# Patient Record
Sex: Male | Born: 1990
Health system: Southern US, Community
[De-identification: ages and names within clinical notes are randomized; demographics above are authoritative.]

## PROBLEM LIST (undated history)

## (undated) DIAGNOSIS — T7840XA Allergy, unspecified, initial encounter: Secondary | ICD-10-CM

## (undated) DIAGNOSIS — H1010 Acute atopic conjunctivitis, unspecified eye: Secondary | ICD-10-CM

## (undated) HISTORY — DX: Allergy, unspecified, initial encounter: T78.40XA

## (undated) HISTORY — DX: Acute atopic conjunctivitis, unspecified eye: H10.10

---

## 2009-01-05 ENCOUNTER — Ambulatory Visit: Payer: Self-pay | Admitting: Family Medicine

## 2009-01-05 DIAGNOSIS — H1045 Other chronic allergic conjunctivitis: Secondary | ICD-10-CM

## 2009-01-05 DIAGNOSIS — J309 Allergic rhinitis, unspecified: Secondary | ICD-10-CM | POA: Insufficient documentation

## 2009-06-07 ENCOUNTER — Ambulatory Visit: Payer: Self-pay | Admitting: Family Medicine

## 2009-06-07 DIAGNOSIS — R21 Rash and other nonspecific skin eruption: Secondary | ICD-10-CM | POA: Insufficient documentation

## 2009-06-07 DIAGNOSIS — R55 Syncope and collapse: Secondary | ICD-10-CM

## 2009-06-22 ENCOUNTER — Encounter: Payer: Self-pay | Admitting: Family Medicine

## 2009-06-22 ENCOUNTER — Ambulatory Visit: Payer: Self-pay

## 2009-08-01 ENCOUNTER — Telehealth: Payer: Self-pay | Admitting: Family Medicine

## 2009-08-02 ENCOUNTER — Ambulatory Visit: Payer: Self-pay | Admitting: Family Medicine

## 2009-08-02 DIAGNOSIS — R5383 Other fatigue: Secondary | ICD-10-CM

## 2009-08-02 DIAGNOSIS — R5381 Other malaise: Secondary | ICD-10-CM

## 2009-08-07 ENCOUNTER — Encounter (INDEPENDENT_AMBULATORY_CARE_PROVIDER_SITE_OTHER): Payer: Self-pay | Admitting: *Deleted

## 2009-08-07 LAB — CONVERTED CEMR LAB: Hgb A2 Quant: 2.7 % (ref 2.2–3.2)

## 2010-07-02 ENCOUNTER — Ambulatory Visit: Payer: Self-pay | Admitting: Family Medicine

## 2010-10-17 NOTE — Assessment & Plan Note (Signed)
Summary: SP0RTS PHYSICAL/CLE   Vital Signs:  Patient profile:   20 year old male Height:      72 inches Weight:      170 pounds BMI:     23.14 Temp:     98.4 degrees F oral Pulse rate:   60 / minute Pulse rhythm:   regular BP sitting:   110 / 68  (left arm) Cuff size:   regular  Vitals Entered By: Sydell Axon LPN (July 02, 2010 11:11 AM) CC: Needs sports physical for college  Vision Screening:Left eye w/o correction: 20 / 20 Right Eye w/o correction: 20 / 20 Both eyes w/o correction:  20/ 20         History of Present Illness: Sports physical.  See scanned form.   Allergies: No Known Drug Allergies  Past History:  Past Medical History: Allergic rhinitis Allergic conjunctivitis neg w/u for sickle cell trait/disease  Family History: Reviewed history from 01/05/2009 and no changes required. F alive, healthy M alive, healthy 1 Sister sickle cell trait 1 sister healthy  Social History: Reviewed history from 01/05/2009 and no changes required. 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  Otherwise negative.    Physical Exam  General:  see scanned form.    Impression & Recommendations:  Problem # 1:  ATHLETIC PHYSICAL, NORMAL (ICD-V70.3) Okay for club sports at [employer].  He'll check with his mother about his full shot record.  Flu shot encouraged, he can get this at school.  Healthy by exam.  Healthy habits encouraged.  follow up as needed.  Orders: Sports Physical (Sport)  Complete Medication List: 1)  Allegra 180 Mg Tabs (Fexofenadine hcl) .Marland Kitchen.. 1 by mouth daily as needed as needed 2)  Nasonex 50 Mcg/act Susp (Mometasone furoate) .... 2 sprays each nostril daily as needed 3)  Pataday 0.2 % Soln (Olopatadine hcl) .Marland Kitchen.. 1 drop each eye daily as needed  Patient Instructions: 1)  Let me know if you have any  concerns.  Take care and good luck with school.  Glad to see you today.    Orders Added: 1)  Sports Physical [Sport]    Current Allergies (reviewed today): No known allergies

## 2010-10-17 NOTE — Letter (Signed)
Summary: Sports Physical Form  Sports Physical Form   Imported By: Maryln Gottron 07/06/2010 15:41:42  _____________________________________________________________________  External Attachment:    Type:   Image     Comment:   External Document

## 2012-05-27 ENCOUNTER — Encounter: Payer: Self-pay | Admitting: Family Medicine

## 2012-05-27 ENCOUNTER — Ambulatory Visit (INDEPENDENT_AMBULATORY_CARE_PROVIDER_SITE_OTHER): Payer: BC Managed Care – PPO | Admitting: Family Medicine

## 2012-05-27 VITALS — BP 112/76 | HR 75 | Temp 98.6°F | Ht 73.0 in | Wt 173.2 lb

## 2012-05-27 DIAGNOSIS — Z0000 Encounter for general adult medical examination without abnormal findings: Secondary | ICD-10-CM

## 2012-05-27 NOTE — Progress Notes (Signed)
Nature conservation officer at [facility] [address] [place] [address] Phone: [phone] Fax: [phone]  Date:  05/27/2012   Name:  Eric Burch   DOB:  1991/01/02   MRN:  [id number] Gender: male Age: 21 y.o.  PCP:  Hannah Beat, MD    Chief Complaint: Annual Exam   History of Present Illness:  Eric Burch is a 21 y.o. pleasant patient who presents with the following:  The patient is here for an annual exam, and a sports physical. He is generally feeling perfectly well other than having some mild flare up of his allergies with allergic rhinitis and allergic conjunctivitis.  He denies any STDs exposure or risk. No recent orthopedic trauma or injuries. No prior surgeries. No family history of sudden death or sudden cardiac death at an early age.  The patient did have an episode where he got lightheaded and passed out in 2010, but ultimately had an echocardiogram that was perfectly normal. This was noted from record review.  Patient Active Problem List  Diagnosis  . ALLERGIC CONJUNCTIVITIS  . ALLERGIC RHINITIS    Past Medical History  Diagnosis Date  . Allergic rhinitis   . Allergic conjunctivitis     No past surgical history on file.  History  Substance Use Topics  . Smoking status: Never Smoker   . Smokeless tobacco: Not on file  . Alcohol Use: No    Family History  Problem Relation Age of Onset  . Sickle cell trait Sister     Allergies not on file  Medication list has been reviewed and updated.  Current Outpatient Prescriptions on File Prior to Visit  Medication Sig Dispense Refill  . fexofenadine (ALLEGRA) 180 MG tablet Take 180 mg by mouth daily.        . mometasone (NASONEX) 50 MCG/ACT nasal spray Place 2 sprays into the nose daily as needed.        . Olopatadine HCl (PATADAY) 0.2 % SOLN Apply to eye daily as needed.          Review of Systems:  No fevers chills or sweats. No chest pain or shortness of breath. No recent orthopedic  injuries. Otherwise, the pertinent positives and negatives are listed above and in the HPI, otherwise a full review of systems has been reviewed and is negative unless noted positive.   Physical Examination: Filed Vitals:   05/27/12 1029  BP: 112/76  Pulse: 75  Temp: 98.6 F (37 C)   Filed Vitals:   05/27/12 1029  Height: 6\' 1"  (1.854 m)  Weight: 173 lb 4 oz (78.586 kg)   Body mass index is 22.86 kg/(m^2). Ideal Body Weight: Weight in (lb) to have BMI = 25: 189.1   Vision: 20/20  GEN: well developed, well nourished, no acute distress Eyes: conjunctiva and lids normal, PERRLA, EOMI ENT: TM clear, nares clear, oral exam WNL Neck: supple, no lymphadenopathy, no thyromegaly, no JVD Pulm: clear to auscultation and percussion, respiratory effort normal CV: regular rate and rhythm, S1-S2, no murmur, rub or gallop, no bruits, peripheral pulses normal and symmetric, no cyanosis, clubbing, edema or varicosities GI: soft, non-tender; no hepatosplenomegaly, masses; active bowel sounds all quadrants Lymph: no cervical, axillary or inguinal adenopathy MSK: gait normal, muscle tone and strength WNL, no joint swelling, effusions, discoloration, crepitus  Upper extremity strength and lower extremity strength is 5/5. Full range of motion in all directions.  Neck range of motion is full and normal.  Knee excellent range of  motion. No pain on the joint lines. MCL and LCL are stable. Negative Lachman. Negative posterior and anterior drawer testing.  Ankles are stable with a slight increase in laxity on the left ankle which correlates to a prior ankle sprain. Strength is preserved throughout.  All bony anatomy is nontender. SKIN: clear, good turgor, color WNL, no rashes, lesions, or ulcerations Neuro: normal mental status, normal strength, sensation, and motion Psych: alert; oriented to person, place and time, normally interactive and not anxious or depressed in appearance.   Assessment and  Plan:  1. Routine general medical examination at a health care facility    Patient doing well and very healthy with some occasional allergy symptoms right now.  From a sports standpoint, he should be cleared to play without any limitations.  Followup when necessary.   Hannah Beat, MD 05/27/2012, 10:49 AM

## 2014-03-06 ENCOUNTER — Encounter (HOSPITAL_COMMUNITY): Payer: Self-pay | Admitting: Emergency Medicine

## 2014-03-06 ENCOUNTER — Emergency Department (HOSPITAL_COMMUNITY)
Admission: EM | Admit: 2014-03-06 | Discharge: 2014-03-06 | Disposition: A | Discharge status: Home / Self Care | Payer: BC Managed Care – PPO | Attending: Emergency Medicine | Admitting: Emergency Medicine

## 2014-03-06 DIAGNOSIS — IMO0002 Reserved for concepts with insufficient information to code with codable children: Secondary | ICD-10-CM | POA: Insufficient documentation

## 2014-03-06 DIAGNOSIS — Z79899 Other long term (current) drug therapy: Secondary | ICD-10-CM | POA: Insufficient documentation

## 2014-03-06 DIAGNOSIS — Z8669 Personal history of other diseases of the nervous system and sense organs: Secondary | ICD-10-CM | POA: Insufficient documentation

## 2014-03-06 DIAGNOSIS — Z8709 Personal history of other diseases of the respiratory system: Secondary | ICD-10-CM | POA: Insufficient documentation

## 2014-03-06 DIAGNOSIS — H1045 Other chronic allergic conjunctivitis: Secondary | ICD-10-CM | POA: Insufficient documentation

## 2014-03-06 DIAGNOSIS — R599 Enlarged lymph nodes, unspecified: Secondary | ICD-10-CM | POA: Insufficient documentation

## 2014-03-06 DIAGNOSIS — B2790 Infectious mononucleosis, unspecified without complication: Secondary | ICD-10-CM | POA: Insufficient documentation

## 2014-03-06 LAB — RAPID STREP SCREEN (MED CTR MEBANE ONLY): Streptococcus, Group A Screen (Direct): NEGATIVE

## 2014-03-06 MED ORDER — PREDNISONE 20 MG PO TABS: 40.0000 mg | ORAL_TABLET | Freq: Every day | ORAL | Status: DC

## 2014-03-06 NOTE — ED Notes (Signed)
Pt reports that he was recently dx with mono. States he has been taking prednisone, tylenol cold/flu, and sore throat spray but states he is continuing to have sore throat. States "while I was in church today it started to feel like my throat was swelling and I couldn't breathe very well." Pt airway intact, white coating noted to right side of throat. Pt speaks in complete sentences, no drooling. Lungs clear.

## 2014-03-06 NOTE — Discharge Instructions (Signed)
Infectious Mononucleosis Infectious mononucleosis (mono) is a common viral infection. You can get mono from close contact with someone who is infected. If you have mono, you may have a sore throat, headache, feel tired, or have a fever. HOME CARE  Drink enough fluids to keep your pee (urine) clear or pale yellow.  Eat soft foods. Eat cold foods (frozen ice pops, ice cream) to make your throat feel better.  Only take medicines as told by your doctor. Do not give aspirin to children.  Rest as needed. Have someone with you to make sure you do not get worse.  Do not  play contact sports. Avoid activities where you could get hurt for 3 to 4 weeks. The organ that cleans your blood (spleen) might be puffy (swollen), and it could get hurt.  Wash your hands or use hand sanitizer often. Avoid kissing or sharing your drinking glass until your doctor says you are better.  Keep all follow-up visits as told by your doctor. GET HELP RIGHT AWAY IF:   You have strong pain in your stomach or shoulder.  You have trouble breathing or swallowing.  You are confused.  You get a strong headache or stiff neck.  You are shaking (convulsing).  You keep throwing up (vomiting).  You are weak, with pale skin, dry mouth, and fast heartbeat.  Your fever does not go away after 7 days.  You cannot return to normal activities after 2 weeks.  You have yellow color in the eyes and skin (jaundice). MAKE SURE YOU:   Understand these instructions.  Will watch your condition.  Will get help right away if you are not doing well or get worse. Document Released: 08/21/2009 Document Revised: 11/25/2011 Document Reviewed: 08/21/2009 [facility] Patient Information 2015 [employer] [place]. This information is not intended to replace advice given to you by your health care provider. Make sure you discuss any questions you have with your health care provider.

## 2014-03-06 NOTE — ED Provider Notes (Signed)
CSN: [id number]     Arrival date & time 03/06/14  1156 History   First MD Initiated Contact with Patient 03/06/14 1243     Chief Complaint  Patient presents with  . Sore Throat     (Consider location/radiation/quality/duration/timing/severity/associated sxs/prior Treatment) HPI Comments: Patient is an otherwise healthy 23 year old male who presents today with persistent sore throat. He reports that on Tuesday [place] mono via blood test. He has been having sore throat which is worse with swallowing since Tuesday. He does not feel improved. He has had subjective fevers. No abdominal pain, nausea, vomiting. He has been taking prednisone and Aleve as prescribed. He last took Aleve at 730 and prednisone at 11. He reports that while he was in church today it felt as though his throat was closing. He currently does not feel short of breath.  Patient is a 23 y.o. male presenting with pharyngitis. The history is provided by the patient. No language interpreter was used.  Sore Throat Associated symptoms include fatigue, a fever and a sore throat. Pertinent negatives include no abdominal pain, chest pain, nausea or vomiting.    Past Medical History  Diagnosis Date  . Allergic rhinitis   . Allergic conjunctivitis    History reviewed. No pertinent past surgical history. Family History  Problem Relation Age of Onset  . Sickle cell trait Sister    History  Substance Use Topics  . Smoking status: Never Smoker   . Smokeless tobacco: Not on file  . Alcohol Use: No    Review of Systems  Constitutional: Positive for fever and fatigue.  HENT: Positive for sore throat. Negative for ear pain.   Respiratory: Negative for shortness of breath.   Cardiovascular: Negative for chest pain.  Gastrointestinal: Negative for nausea, vomiting and abdominal pain.  All other systems reviewed and are negative.     Allergies  Review of patient's  allergies indicates no known allergies.  Home Medications   Prior to Admission medications   Medication Sig Start Date End Date Taking? Authorizing Provider  fexofenadine (ALLEGRA) 180 MG tablet Take 180 mg by mouth daily.      Historical Provider, MD  mometasone (NASONEX) 50 MCG/ACT nasal spray Place 2 sprays into the nose daily as needed.      Historical Provider, MD  Olopatadine HCl (PATADAY) 0.2 % SOLN Apply to eye daily as needed.      Historical Provider, MD   BP 138/76  Pulse 73  Temp(Src) 98.6 F (37 C) (Oral)  Resp 20  SpO2 99% Physical Exam  Nursing note and vitals reviewed. Constitutional: He is oriented to person, place, and time. He appears well-developed and well-nourished. No distress.  Non toxic appearing.   HENT:  Head: Normocephalic and atraumatic.  Right Ear: External ear normal.  Left Ear: External ear normal.  Nose: Nose normal.  Mouth/Throat: Uvula is midline. No trismus in the jaw. Oropharyngeal exudate and posterior oropharyngeal erythema present. No tonsillar abscesses.  Bilateral tonsillar enlargement with exudate. Easily maintaining own secretions. No drooling. No trismus, submental edema, tongue elevation.   Eyes: Conjunctivae are normal.  Neck: Normal range of motion. No tracheal deviation present.  No nuchal rigidity or meningeal signs  Cardiovascular: Normal rate, regular rhythm and normal heart sounds.   Pulmonary/Chest: Effort normal and breath sounds normal. No stridor.  Abdominal: Soft. He exhibits no distension. There is no tenderness. There is no rigidity and no guarding.  Musculoskeletal: Normal  range of motion.  Lymphadenopathy:    He has cervical adenopathy.  Neurological: He is alert and oriented to person, place, and time.  Skin: Skin is warm and dry. He is not diaphoretic.  Psychiatric: He has a normal mood and affect. His behavior is normal.    ED Course  Procedures (including critical care time) Labs Review Labs Reviewed  RAPID  STREP SCREEN  CULTURE, GROUP A STREP    Imaging Review No results found.   EKG Interpretation None      MDM   Final diagnoses:  Mononucleosis    Patient presents to ED with persistent sore throat after being diagnosed with mononucleosis via monospot on Tuesday. Patient has been compliant with prednisone and aleve for symptoms. Today patient does have bilateral tonsillar enlargement with exudate. No trismus, submental edema, tongue elevation. No nuchal rigidity or meningeal signs. Patient is nontoxic appearing. No concern for PTA, RPA, deep space infection, meningitis. Discussed course of mono with patient and provided note for work. Gave patient extension of prednisone and encouraged patient to take 5 day burst when finished with his steroids. Discussed strict return instructions. Discussed case with Dr. Fonnie JarvisBednar who agrees with plan. Vital signs stable for discharge. Patient / Family / Caregiver informed of clinical course, understand medical decision-making process, and agree with plan.     Mora BellmanHannah S Jibril Mcminn, PA-C 03/06/14 1542

## 2014-03-07 NOTE — ED Provider Notes (Signed)
Medical screening examination/treatment/procedure(s) were performed by non-physician practitioner and as supervising physician I was immediately available for consultation/collaboration.   EKG Interpretation None       Hurman HornJohn M Bednar, MD 03/07/14 1424

## 2014-03-08 LAB — CULTURE, GROUP A STREP

## 2014-06-24 ENCOUNTER — Ambulatory Visit: Payer: BC Managed Care – PPO | Admitting: Internal Medicine

## 2014-06-24 DIAGNOSIS — Z0289 Encounter for other administrative examinations: Secondary | ICD-10-CM

## 2015-07-28 ENCOUNTER — Ambulatory Visit (INDEPENDENT_AMBULATORY_CARE_PROVIDER_SITE_OTHER): Payer: 59 | Admitting: Emergency Medicine

## 2015-07-28 VITALS — BP 108/72 | HR 78 | Temp 98.8°F | Resp 18 | Ht 74.0 in | Wt 179.0 lb

## 2015-07-28 DIAGNOSIS — J0140 Acute pansinusitis, unspecified: Secondary | ICD-10-CM | POA: Diagnosis not present

## 2015-07-28 MED ORDER — PSEUDOEPHEDRINE-GUAIFENESIN ER 60-600 MG PO TB12: 1.0000 | ORAL_TABLET | Freq: Two times a day (BID) | ORAL | Status: AC

## 2015-07-28 MED ORDER — AMOXICILLIN-POT CLAVULANATE 875-125 MG PO TABS: 1.0000 | ORAL_TABLET | Freq: Two times a day (BID) | ORAL | Status: DC

## 2015-07-28 NOTE — Patient Instructions (Signed)

## 2015-07-28 NOTE — Progress Notes (Signed)
Subjective:  Patient ID: Eric Burch, male    DOB: 1990-11-27  Age: 24 y.o. MRN: [id number]  CC: Sinusitis; Facial Pain; Fever; Chills; Nasal Congestion; Diarrhea; and Emesis   HPI Eric Burch presents  patient nail last 2 weeks with nasal congestion postnasal drainage and nasal discharge. Purulent collar. He has night sweats and fevers. He's has pressure and pain around his cheeks and forehead. He has no nausea or vomiting. He's had some loose stool at times. He's had no improvement with over-the-counter medication.  History Eric Burch has a past medical history of Allergic rhinitis; Allergic conjunctivitis; and Allergy.   He has no past surgical history on file.   His  family history includes Sickle cell trait in his sister.  He   reports that he has never smoked. He does not have any smokeless tobacco history on file. He reports that he drinks alcohol. He reports that he does not use illicit drugs.  Outpatient Prescriptions Prior to Visit  Medication Sig Dispense Refill  . ibuprofen (ADVIL,MOTRIN) 200 MG tablet Take 200 mg by mouth every 6 (six) hours as needed.    . mometasone (NASONEX) 50 MCG/ACT nasal spray Place 2 sprays into the nose daily as needed.      . naproxen sodium (ANAPROX) 220 MG tablet Take 220 mg by mouth 2 (two) times daily as needed (for pain).    . predniSONE (DELTASONE) 10 MG tablet Take 10 mg by mouth See admin instructions. 6 day pack    . predniSONE (DELTASONE) 20 MG tablet Take 2 tablets (40 mg total) by mouth daily. (Patient not taking: Reported on 07/28/2015) 10 tablet 0   No facility-administered medications prior to visit.    Social History   Social History  . Marital Status: Significant Other    Spouse Name: N/A  . Number of Children: N/A  . Years of Education: N/A   Social History Main Topics  . Smoking status: Never Smoker   . Smokeless tobacco: None  . Alcohol Use: 0.0 - 0.6 oz/week    0-1 Standard drinks or equivalent per week  .  Drug Use: No  . Sexual Activity: Not Asked   Other Topics Concern  . 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 Regularly exercises.      Review of Systems  Constitutional: Positive for fever, chills and fatigue. Negative for appetite change.  HENT: Positive for congestion, postnasal drip, rhinorrhea and sinus pressure. Negative for ear pain and sore throat.   Eyes: Negative for pain and redness.  Respiratory: Negative for cough, shortness of breath and wheezing.   Cardiovascular: Negative for leg swelling.  Gastrointestinal: Negative for nausea, vomiting, abdominal pain, diarrhea, constipation and blood in stool.  Endocrine: Negative for polyuria.  Genitourinary: Negative for dysuria, urgency, frequency and flank pain.  Musculoskeletal: Negative for gait problem.  Skin: Negative for rash.  Neurological: Negative for weakness and headaches.  Psychiatric/Behavioral: Negative for confusion and decreased concentration. The patient is not nervous/anxious.     Objective:  BP 108/72 mmHg  Pulse 78  Temp(Src) 98.8 F (37.1 C) (Oral)  Resp 18  Ht  (1.88 m)  Wt 179 lb (81.194 kg)  BMI 22.97 kg/m2  SpO2 98%  Physical Exam  Constitutional: He is oriented to person, place, and time. He appears well-developed and well-nourished. No distress.  HENT:  Head: Normocephalic and atraumatic.  Right Ear: External ear normal.  Left Ear: External ear normal.  Nose: Nose normal.  Eyes: Conjunctivae and EOM are normal. Pupils are equal, round, and reactive to light. No scleral icterus.  Neck: Normal range of motion. Neck supple. No tracheal deviation present.  Cardiovascular: Normal rate, regular rhythm and normal heart sounds.   Pulmonary/Chest: Effort normal. No respiratory distress. He has no wheezes. He has no rales.  Abdominal: He exhibits no mass. There is no tenderness. There is no rebound and no  guarding.  Musculoskeletal: He exhibits no edema.  Lymphadenopathy:    He has no cervical adenopathy.  Neurological: He is alert and oriented to person, place, and time. Coordination normal.  Skin: Skin is warm and dry. No rash noted.  Psychiatric: He has a normal mood and affect. His behavior is normal.      Assessment & Plan:   Eric Burch was seen today for sinusitis, facial pain, fever, chills, nasal congestion, diarrhea and emesis.  Diagnoses and all orders for this visit:  Acute pansinusitis, recurrence not specified  Other orders -     amoxicillin-clavulanate (AUGMENTIN) 875-125 MG tablet; Take 1 tablet by mouth 2 (two) times daily. -     pseudoephedrine-guaifenesin (MUCINEX D) 60-600 MG 12 hr tablet; Take 1 tablet by mouth every 12 (twelve) hours.   I am having Mr. Eric Burch start on amoxicillin-clavulanate and pseudoephedrine-guaifenesin. I am also having him maintain his mometasone, naproxen sodium, ibuprofen, predniSONE, and predniSONE.  Meds ordered this encounter  Medications  . amoxicillin-clavulanate (AUGMENTIN) 875-125 MG tablet    Sig: Take 1 tablet by mouth 2 (two) times daily.    Dispense:  20 tablet    Refill:  0  . pseudoephedrine-guaifenesin (MUCINEX D) 60-600 MG 12 hr tablet    Sig: Take 1 tablet by mouth every 12 (twelve) hours.    Dispense:  18 tablet    Refill:  0    Appropriate red flag conditions were discussed with the patient as well as actions that should be taken.  Patient expressed his understanding.  Follow-up: Return if symptoms worsen or fail to improve.  Carmelina DaneAnderson, Jeffery S, MD

## 2017-11-28 ENCOUNTER — Encounter (HOSPITAL_COMMUNITY): Payer: Self-pay

## 2017-11-28 ENCOUNTER — Emergency Department (HOSPITAL_COMMUNITY): Payer: 59

## 2017-11-28 ENCOUNTER — Other Ambulatory Visit: Payer: Self-pay

## 2017-11-28 ENCOUNTER — Emergency Department (HOSPITAL_COMMUNITY)
Admission: EM | Admit: 2017-11-28 | Discharge: 2017-11-28 | Disposition: A | Discharge status: Home / Self Care | Payer: 59 | Attending: Emergency Medicine | Admitting: Emergency Medicine

## 2017-11-28 DIAGNOSIS — Y9367 Activity, basketball: Secondary | ICD-10-CM | POA: Insufficient documentation

## 2017-11-28 DIAGNOSIS — Y92310 Basketball court as the place of occurrence of the external cause: Secondary | ICD-10-CM | POA: Insufficient documentation

## 2017-11-28 DIAGNOSIS — S86012A Strain of left Achilles tendon, initial encounter: Secondary | ICD-10-CM | POA: Diagnosis not present

## 2017-11-28 DIAGNOSIS — S99912A Unspecified injury of left ankle, initial encounter: Secondary | ICD-10-CM | POA: Diagnosis not present

## 2017-11-28 DIAGNOSIS — X509XXA Other and unspecified overexertion or strenuous movements or postures, initial encounter: Secondary | ICD-10-CM | POA: Diagnosis not present

## 2017-11-28 DIAGNOSIS — M25572 Pain in left ankle and joints of left foot: Secondary | ICD-10-CM | POA: Diagnosis not present

## 2017-11-28 DIAGNOSIS — Y998 Other external cause status: Secondary | ICD-10-CM | POA: Insufficient documentation

## 2017-11-28 MED ORDER — HYDROCODONE-ACETAMINOPHEN 5-325 MG PO TABS: 1.0000 | ORAL_TABLET | Freq: Four times a day (QID) | ORAL | 0 refills | Status: DC | PRN

## 2017-11-28 MED ORDER — DICLOFENAC SODIUM 50 MG PO TBEC: 50.0000 mg | DELAYED_RELEASE_TABLET | Freq: Two times a day (BID) | ORAL | 0 refills | Status: DC

## 2017-11-28 NOTE — ED Provider Notes (Signed)
[facility] Provider Note   CSN: [id number] Arrival date & time: 11/28/17  1924     History   Chief Complaint Chief Complaint  Patient presents with  . Ankle Pain    right    HPI Eric Burch is a 27 y.o. male who presents to the ED with ankle  pain. Patient reports that in high school he played basketball but only occasionally since then. Tonight he was playing and while on the court felt a pop and severe pain in the back of his left heel. He has had difficulty trying to bear weight due to the pain. The injury occurred approximately one hour prior to arrival to the ED.  HPI  Past Medical History:  Diagnosis Date  . Allergic conjunctivitis   . Allergic rhinitis   . Allergy     Patient Active Problem List   Diagnosis Date Noted  . ALLERGIC CONJUNCTIVITIS 01/05/2009  . ALLERGIC RHINITIS 01/05/2009    History reviewed. No pertinent surgical history.     Home Medications    Prior to Admission medications   Medication Sig Start Date End Date Taking? Authorizing Provider  amoxicillin-clavulanate (AUGMENTIN) 875-125 MG tablet Take 1 tablet by mouth 2 (two) times daily. 07/28/15   Carmelina DaneAnderson, Jeffery S, MD  diclofenac (VOLTAREN) 50 MG EC tablet Take 1 tablet (50 mg total) by mouth 2 (two) times daily. 11/28/17   Janne NapoleonNeese, Aurorah Schlachter M, NP  HYDROcodone-acetaminophen (NORCO) 5-325 MG tablet Take 1 tablet by mouth every 6 (six) hours as needed. 11/28/17   Janne NapoleonNeese, Bryceson Grape M, NP  ibuprofen (ADVIL,MOTRIN) 200 MG tablet Take 200 mg by mouth every 6 (six) hours as needed.    [provider]  mometasone (NASONEX) 50 MCG/ACT nasal spray Place 2 sprays into the nose daily as needed.      [provider]  naproxen sodium (ANAPROX) 220 MG tablet Take 220 mg by mouth 2 (two) times daily as needed (for pain).    [provider]  predniSONE (DELTASONE) 10 MG tablet Take 10 mg by mouth See admin instructions. 6 day pack    [provider]  predniSONE (DELTASONE) 20 MG tablet Take 2 tablets (40 mg total) by mouth daily. Patient not taking: Reported on 07/28/2015 03/06/14   Junious SilkMerrell, Hannah, PA-C    Family History Family History  Problem Relation Age of Onset  . Sickle cell trait Sister     Social History Social History   Tobacco Use  . Smoking status: Never Smoker  Substance Use Topics  . Alcohol use: Yes    Alcohol/week: 0.0 - 0.6 oz  . Drug use: No     Allergies   Patient has no known allergies.   Review of Systems Review of Systems  Musculoskeletal: Positive for arthralgias.       Left ankle  All other systems reviewed and are negative.    Physical Exam Updated Vital Signs BP (!) 143/71   Pulse 61   Temp 98.8 F (37.1 C) (Oral)   Resp 20   SpO2 100%   Physical Exam  Constitutional: He appears well-developed and well-nourished. No distress.  HENT:  Head: Normocephalic and atraumatic.  Eyes: EOM are normal.  Neck: Neck supple.  Cardiovascular: Normal rate.  Pulmonary/Chest: Effort normal.  Musculoskeletal:       Left ankle: He exhibits decreased range of motion. He exhibits no ecchymosis and normal pulse. Tenderness. Achilles tendon exhibits pain, defect and abnormal Thompson's test results.  Neurological: He is alert.  Skin: Skin is warm and dry.  Psychiatric: He has a normal mood and affect.  Nursing note and vitals reviewed.    ED Treatments / Results  Labs (all labs ordered are listed, but only abnormal results are displayed) Labs Reviewed - No data to display Radiology Dg Ankle Complete Left  Result Date: 11/28/2017 CLINICAL DATA:  Left ankle pain while playing basketball EXAM: LEFT ANKLE COMPLETE - 3+ VIEW COMPARISON:  None. FINDINGS: No acute fracture or subluxation. Well corticated osseous fragments at the tip of the medial malleolus. No suspicious focal osseous lesions. No radiopaque foreign body. IMPRESSION: No acute fracture or subluxation. Well corticated  osseous fragments at the tip of the medial malleolus are compatible with remote injury. Electronically Signed   By: Delbert Phenix M.D.   On: 11/28/2017 20:51    Procedures Procedures (including critical care time)  Medications Ordered in ED Medications - No data to display  **Dr. Netta Corrigan here to see the patient and will have him come to the office in the morning at 9 am for further evaluation and schedule for surgery to repair the achillis tendon. Patient given Cam walker, crutches, pain managed. Stable for d/c.   Initial Impression / Assessment and Plan / ED Course  I have reviewed the triage vital signs and the nursing notes.  Final Clinical Impressions(s) / ED Diagnoses   Final diagnoses:  Rupture of left Achilles tendon, initial encounter    ED Discharge Orders        Ordered    HYDROcodone-acetaminophen (NORCO) 5-325 MG tablet  Every 6 hours PRN     11/28/17 2137    diclofenac (VOLTAREN) 50 MG EC tablet  2 times daily     11/28/17 2137       Kerrie Buffalo [place], [place] 11/28/17 2208    Doug Sou, MD 11/29/17 [phone]

## 2017-11-28 NOTE — ED Triage Notes (Signed)
Patient here for left ankle pain after playing basketball and hearing a pop while walking.  A&Ox4 no other medical problems.

## 2017-11-28 NOTE — Discharge Instructions (Signed)
Do not drive while taking the pain medication as it will make you sleepy.  °

## 2017-11-28 NOTE — ED Notes (Signed)
Pt taken to xray 

## 2017-11-29 DIAGNOSIS — M66862 Spontaneous rupture of other tendons, left lower leg: Secondary | ICD-10-CM | POA: Diagnosis not present

## 2017-12-01 ENCOUNTER — Other Ambulatory Visit: Payer: Self-pay

## 2017-12-01 ENCOUNTER — Encounter (HOSPITAL_COMMUNITY): Payer: Self-pay | Admitting: *Deleted

## 2017-12-02 NOTE — H&P (Signed)
IKAIKA SHOWERS is an 27 y.o. male.   Chief Complaint: left ankle pain HPI: The patient is a 27 year old male who presented to the ED with the chief complaint of left ankle and foot pain. He was playing basketball on 11/28/17 when he felt a painful pop around his Achilles tendon. He was evaluated at the ED and subsequently at the office where it was discovered that he had a tear of the Achilles tendon on the left. He was placed in a boot to stabilize the area until surgery.   Past Medical History:  Diagnosis Date  . Allergic conjunctivitis   . Allergic rhinitis   . Allergy     History reviewed. No pertinent surgical history.  Family History  Problem Relation Age of Onset  . Deep vein thrombosis Father   . Deep vein thrombosis Sister   . Sickle cell trait Sister    Social History:  reports that  has never smoked. he has never used smokeless tobacco. He reports that he does not drink alcohol or use drugs.  Allergies: No Known Allergies   Current Outpatient Medications:  .  aspirin 325 MG tablet, Take 325 mg by mouth 2 (two) times daily., Disp: , Rfl:  .  diclofenac (VOLTAREN) 50 MG EC tablet, Take 1 tablet (50 mg total) by mouth 2 (two) times daily., Disp: 15 tablet,  .  HYDROcodone-acetaminophen (NORCO) 5-325 MG tablet, Take 1 tablet by mouth every 6 (six) hours as needed. (Patient taking differently: Take 1 tablet by mouth every 6 (six) hours as needed for moderate pain. ), Disp: 15 tablet,  .  ibuprofen (ADVIL,MOTRIN) 200 MG tablet, Take 800 mg by mouth every 6 (six) hours as needed for headache or moderate pain. , Disp: , Rfl:  .  NON FORMULARY, Take 1 Scoop by mouth daily. Gold Standard 100 % Whey Protein Powder, Disp: , Rfl:   Review of Systems  Constitutional: Negative.   HENT: Negative.   Eyes: Negative.   Respiratory: Negative.   Cardiovascular: Negative.   Gastrointestinal: Negative.   Genitourinary: Negative.   Musculoskeletal: Positive for falls, joint pain and  myalgias. Negative for back pain and neck pain.  Skin: Negative.   Neurological: Negative.   Endo/Heme/Allergies: Negative.   Psychiatric/Behavioral: Negative.    Vitals Ht: 6 ft 1 in  Wt: 185 lbs  BMI: 24.4  BP: 125/80  HR:       68 bpm  Physical Exam  Constitutional: He is oriented to person, place, and time. He appears well-developed and well-nourished. No distress.  HENT:  Head: Normocephalic and atraumatic.  Right Ear: External ear normal.  Left Ear: External ear normal.  Nose: Nose normal.  Mouth/Throat: Oropharynx is clear and moist.  Eyes: Conjunctivae and EOM are normal.  Neck: Normal range of motion. Neck supple.  Cardiovascular: Normal rate, regular rhythm, normal heart sounds and intact distal pulses.  No murmur heard. Respiratory: Effort normal and breath sounds normal. No respiratory distress. He has no wheezes.  GI: Soft. Bowel sounds are normal. He exhibits no distension. There is no tenderness.  Musculoskeletal:       Right knee: Normal.       Left knee: Normal.       Right ankle: Achilles tendon exhibits no pain, no defect and normal Thompson's test results.       Left ankle: He exhibits decreased range of motion and swelling. Achilles tendon exhibits pain, defect and abnormal Thompson's test results.  Neurological: He is  alert and oriented to person, place, and time. He has normal strength. No sensory deficit.  Skin: No rash noted. He is not diaphoretic. No erythema.  Psychiatric: He has a normal mood and affect. His behavior is normal.     Assessment/Plan Left Achilles tendon tear Reuel BoomDaniel has a tear of the left Achilles tendon, requiring surgical intervention. Risks and benefits of the procedure were discussed with the patient by Dr. Ranee Gosselinonald Gioffre, MD. He will stay overnight for observation.   H&P performed by Dr. Darrelyn HillockGioffre Documented by Dimitri PedAmber Lizzie An, PA-C  Bradrick Kamau Leotis ShamesLAUREN, PA-C 12/02/2017, 4:12 PM

## 2017-12-03 ENCOUNTER — Ambulatory Visit (HOSPITAL_COMMUNITY): Payer: 59 | Admitting: Anesthesiology

## 2017-12-03 ENCOUNTER — Encounter (HOSPITAL_COMMUNITY)
Admission: RE | Disposition: A | Discharge status: Home / Self Care | Payer: Self-pay | Source: Ambulatory Visit | Attending: Orthopedic Surgery

## 2017-12-03 ENCOUNTER — Other Ambulatory Visit: Payer: Self-pay

## 2017-12-03 ENCOUNTER — Encounter (HOSPITAL_COMMUNITY): Payer: Self-pay | Admitting: *Deleted

## 2017-12-03 ENCOUNTER — Observation Stay (HOSPITAL_COMMUNITY)
Admission: RE | Admit: 2017-12-03 | Discharge: 2017-12-04 | Disposition: A | Discharge status: Home / Self Care | Payer: 59 | Source: Ambulatory Visit | Attending: Orthopedic Surgery | Admitting: Orthopedic Surgery

## 2017-12-03 DIAGNOSIS — Z8249 Family history of ischemic heart disease and other diseases of the circulatory system: Secondary | ICD-10-CM | POA: Insufficient documentation

## 2017-12-03 DIAGNOSIS — J309 Allergic rhinitis, unspecified: Secondary | ICD-10-CM | POA: Diagnosis not present

## 2017-12-03 DIAGNOSIS — S86012A Strain of left Achilles tendon, initial encounter: Secondary | ICD-10-CM | POA: Diagnosis not present

## 2017-12-03 DIAGNOSIS — M66872 Spontaneous rupture of other tendons, left ankle and foot: Secondary | ICD-10-CM | POA: Diagnosis not present

## 2017-12-03 DIAGNOSIS — Z832 Family history of diseases of the blood and blood-forming organs and certain disorders involving the immune mechanism: Secondary | ICD-10-CM | POA: Diagnosis not present

## 2017-12-03 DIAGNOSIS — Z79899 Other long term (current) drug therapy: Secondary | ICD-10-CM | POA: Diagnosis not present

## 2017-12-03 DIAGNOSIS — Y9367 Activity, basketball: Secondary | ICD-10-CM | POA: Diagnosis not present

## 2017-12-03 DIAGNOSIS — Z7982 Long term (current) use of aspirin: Secondary | ICD-10-CM | POA: Insufficient documentation

## 2017-12-03 HISTORY — PX: ACHILLES TENDON SURGERY: SHX542

## 2017-12-03 LAB — CREATININE, SERUM: Creatinine, Ser: 1.16 mg/dL (ref 0.61–1.24)

## 2017-12-03 LAB — CBC
HEMATOCRIT: 48.2 % (ref 39.0–52.0)
HEMOGLOBIN: 16.7 g/dL (ref 13.0–17.0)
MCH: 29.3 pg (ref 26.0–34.0)
MCHC: 34.6 g/dL (ref 30.0–36.0)
MCV: 84.7 fL (ref 78.0–100.0)
Platelets: 242 10*3/uL (ref 150–400)
RBC: 5.69 MIL/uL (ref 4.22–5.81)
RDW: 12.8 % (ref 11.5–15.5)
WBC: 8.3 10*3/uL (ref 4.0–10.5)

## 2017-12-03 SURGERY — REPAIR, TENDON, ACHILLES
Anesthesia: General | Laterality: Left

## 2017-12-03 MED ORDER — METOCLOPRAMIDE HCL 5 MG PO TABS: 5.0000 mg | ORAL_TABLET | Freq: Three times a day (TID) | ORAL | Status: DC | PRN

## 2017-12-03 MED ORDER — LACTATED RINGERS IV SOLN
INTRAVENOUS | Status: DC
  Administered 2017-12-03: 09:00:00 via INTRAVENOUS

## 2017-12-03 MED ORDER — SUGAMMADEX SODIUM 200 MG/2ML IV SOLN
INTRAVENOUS | Status: DC | PRN
  Administered 2017-12-03: 200 mg via INTRAVENOUS

## 2017-12-03 MED ORDER — FENTANYL CITRATE (PF) 100 MCG/2ML IJ SOLN
INTRAMUSCULAR | Status: DC | PRN
  Administered 2017-12-03 (×2): 50 ug via INTRAVENOUS
  Administered 2017-12-03: 100 ug via INTRAVENOUS

## 2017-12-03 MED ORDER — SODIUM CHLORIDE 0.9 % IV SOLN
INTRAVENOUS | Status: AC
  Filled 2017-12-03: qty 500000

## 2017-12-03 MED ORDER — SUCCINYLCHOLINE CHLORIDE 200 MG/10ML IV SOSY
PREFILLED_SYRINGE | INTRAVENOUS | Status: DC | PRN
  Administered 2017-12-03: 100 mg via INTRAVENOUS

## 2017-12-03 MED ORDER — BUPIVACAINE HCL (PF) 0.5 % IJ SOLN
INTRAMUSCULAR | Status: DC | PRN
  Administered 2017-12-03: 10 mL

## 2017-12-03 MED ORDER — DEXAMETHASONE SODIUM PHOSPHATE 10 MG/ML IJ SOLN
INTRAMUSCULAR | Status: DC | PRN
  Administered 2017-12-03: 10 mg via INTRAVENOUS

## 2017-12-03 MED ORDER — ONDANSETRON HCL 4 MG/2ML IJ SOLN
INTRAMUSCULAR | Status: AC
  Filled 2017-12-03: qty 2

## 2017-12-03 MED ORDER — POLYMYXIN B SULFATE 500000 UNITS IJ SOLR
INTRAMUSCULAR | Status: DC | PRN
  Administered 2017-12-03: 500 mL

## 2017-12-03 MED ORDER — FERROUS SULFATE 325 (65 FE) MG PO TABS
325.0000 mg | ORAL_TABLET | Freq: Three times a day (TID) | ORAL | Status: DC
  Administered 2017-12-03 – 2017-12-04 (×2): 325 mg via ORAL
  Filled 2017-12-03 (×2): qty 1

## 2017-12-03 MED ORDER — OXYCODONE HCL 5 MG PO TABS
10.0000 mg | ORAL_TABLET | ORAL | Status: DC | PRN
  Administered 2017-12-04: 11:00:00 10 mg via ORAL
  Filled 2017-12-03: qty 2

## 2017-12-03 MED ORDER — ACETAMINOPHEN 500 MG PO TABS
1000.0000 mg | ORAL_TABLET | Freq: Four times a day (QID) | ORAL | Status: DC
  Administered 2017-12-03 – 2017-12-04 (×3): 1000 mg via ORAL
  Filled 2017-12-03 (×3): qty 2

## 2017-12-03 MED ORDER — HYDROMORPHONE HCL 1 MG/ML IJ SOLN
INTRAMUSCULAR | Status: DC | PRN
  Administered 2017-12-03 (×2): 0.5 mg via INTRAVENOUS

## 2017-12-03 MED ORDER — ONDANSETRON HCL 4 MG PO TABS: 4.0000 mg | ORAL_TABLET | Freq: Four times a day (QID) | ORAL | Status: DC | PRN

## 2017-12-03 MED ORDER — FENTANYL CITRATE (PF) 100 MCG/2ML IJ SOLN
25.0000 ug | INTRAMUSCULAR | Status: DC | PRN
  Administered 2017-12-03: 25 ug via INTRAVENOUS

## 2017-12-03 MED ORDER — ENOXAPARIN SODIUM 40 MG/0.4ML ~~LOC~~ SOLN
40.0000 mg | SUBCUTANEOUS | Status: DC
  Administered 2017-12-04: 40 mg via SUBCUTANEOUS
  Filled 2017-12-03: qty 0.4

## 2017-12-03 MED ORDER — ONDANSETRON HCL 4 MG/2ML IJ SOLN: 4.0000 mg | Freq: Four times a day (QID) | INTRAMUSCULAR | Status: DC | PRN

## 2017-12-03 MED ORDER — BUPIVACAINE LIPOSOME 1.3 % IJ SUSP
20.0000 mL | Freq: Once | INTRAMUSCULAR | Status: DC
  Filled 2017-12-03: qty 20

## 2017-12-03 MED ORDER — MIDAZOLAM HCL 2 MG/2ML IJ SOLN
INTRAMUSCULAR | Status: AC
  Filled 2017-12-03: qty 2

## 2017-12-03 MED ORDER — LIDOCAINE 2% (20 MG/ML) 5 ML SYRINGE
INTRAMUSCULAR | Status: DC | PRN
  Administered 2017-12-03: 80 mg via INTRAVENOUS

## 2017-12-03 MED ORDER — CEFAZOLIN SODIUM-DEXTROSE 2-4 GM/100ML-% IV SOLN
2.0000 g | INTRAVENOUS | Status: AC
  Administered 2017-12-03: 2 g via INTRAVENOUS
  Filled 2017-12-03: qty 100

## 2017-12-03 MED ORDER — PROPOFOL 10 MG/ML IV BOLUS
INTRAVENOUS | Status: DC | PRN
  Administered 2017-12-03: 200 mg via INTRAVENOUS

## 2017-12-03 MED ORDER — LACTATED RINGERS IV SOLN
INTRAVENOUS | Status: DC
  Administered 2017-12-03 – 2017-12-04 (×2): via INTRAVENOUS

## 2017-12-03 MED ORDER — PROPOFOL 10 MG/ML IV BOLUS
INTRAVENOUS | Status: AC
  Filled 2017-12-03: qty 20

## 2017-12-03 MED ORDER — ACETAMINOPHEN 325 MG PO TABS: 325.0000 mg | ORAL_TABLET | Freq: Four times a day (QID) | ORAL | Status: DC | PRN

## 2017-12-03 MED ORDER — CEFAZOLIN SODIUM-DEXTROSE 1-4 GM/50ML-% IV SOLN
1.0000 g | Freq: Four times a day (QID) | INTRAVENOUS | Status: AC
  Administered 2017-12-03 – 2017-12-04 (×3): 1 g via INTRAVENOUS
  Filled 2017-12-03 (×4): qty 50

## 2017-12-03 MED ORDER — FENTANYL CITRATE (PF) 100 MCG/2ML IJ SOLN
INTRAMUSCULAR | Status: AC
  Filled 2017-12-03: qty 2

## 2017-12-03 MED ORDER — SUGAMMADEX SODIUM 200 MG/2ML IV SOLN
INTRAVENOUS | Status: AC
  Filled 2017-12-03: qty 2

## 2017-12-03 MED ORDER — HYDROCODONE-ACETAMINOPHEN 7.5-325 MG PO TABS
1.0000 | ORAL_TABLET | ORAL | Status: DC | PRN
  Administered 2017-12-03 – 2017-12-04 (×3): 1 via ORAL
  Filled 2017-12-03 (×4): qty 1

## 2017-12-03 MED ORDER — CHLORHEXIDINE GLUCONATE 4 % EX LIQD: 60.0000 mL | Freq: Once | CUTANEOUS | Status: DC

## 2017-12-03 MED ORDER — BISACODYL 5 MG PO TBEC: 5.0000 mg | DELAYED_RELEASE_TABLET | Freq: Every day | ORAL | Status: DC | PRN

## 2017-12-03 MED ORDER — METHOCARBAMOL 1000 MG/10ML IJ SOLN
500.0000 mg | Freq: Four times a day (QID) | INTRAVENOUS | Status: DC | PRN
  Filled 2017-12-03: qty 5

## 2017-12-03 MED ORDER — BACITRACIN-NEOMYCIN-POLYMYXIN 400-5-5000 EX OINT
TOPICAL_OINTMENT | CUTANEOUS | Status: DC | PRN
  Administered 2017-12-03: 1 via TOPICAL

## 2017-12-03 MED ORDER — POLYETHYLENE GLYCOL 3350 17 G PO PACK: 17.0000 g | PACK | Freq: Every day | ORAL | Status: DC | PRN

## 2017-12-03 MED ORDER — DEXAMETHASONE SODIUM PHOSPHATE 10 MG/ML IJ SOLN
INTRAMUSCULAR | Status: AC
  Filled 2017-12-03: qty 1

## 2017-12-03 MED ORDER — DOCUSATE SODIUM 100 MG PO CAPS
100.0000 mg | ORAL_CAPSULE | Freq: Two times a day (BID) | ORAL | Status: DC
  Administered 2017-12-03 – 2017-12-04 (×2): 100 mg via ORAL
  Filled 2017-12-03 (×2): qty 1

## 2017-12-03 MED ORDER — HYDROMORPHONE HCL 1 MG/ML IJ SOLN: 0.5000 mg | INTRAMUSCULAR | Status: DC | PRN

## 2017-12-03 MED ORDER — LIDOCAINE 2% (20 MG/ML) 5 ML SYRINGE
INTRAMUSCULAR | Status: AC
  Filled 2017-12-03: qty 5

## 2017-12-03 MED ORDER — SUCCINYLCHOLINE CHLORIDE 200 MG/10ML IV SOSY
PREFILLED_SYRINGE | INTRAVENOUS | Status: AC
  Filled 2017-12-03: qty 10

## 2017-12-03 MED ORDER — MIDAZOLAM HCL 5 MG/5ML IJ SOLN
INTRAMUSCULAR | Status: DC | PRN
  Administered 2017-12-03: 2 mg via INTRAVENOUS

## 2017-12-03 MED ORDER — ONDANSETRON HCL 4 MG/2ML IJ SOLN
INTRAMUSCULAR | Status: DC | PRN
  Administered 2017-12-03: 4 mg via INTRAVENOUS

## 2017-12-03 MED ORDER — HYDROMORPHONE HCL 2 MG/ML IJ SOLN
INTRAMUSCULAR | Status: AC
  Filled 2017-12-03: qty 1

## 2017-12-03 MED ORDER — BACITRACIN ZINC 500 UNIT/GM EX OINT
TOPICAL_OINTMENT | CUTANEOUS | Status: AC
  Filled 2017-12-03: qty 28.35

## 2017-12-03 MED ORDER — METHOCARBAMOL 500 MG PO TABS
500.0000 mg | ORAL_TABLET | Freq: Four times a day (QID) | ORAL | Status: DC | PRN
  Administered 2017-12-03 – 2017-12-04 (×2): 500 mg via ORAL
  Filled 2017-12-03 (×2): qty 1

## 2017-12-03 MED ORDER — METOCLOPRAMIDE HCL 5 MG/ML IJ SOLN: 5.0000 mg | Freq: Three times a day (TID) | INTRAMUSCULAR | Status: DC | PRN

## 2017-12-03 MED ORDER — ROCURONIUM BROMIDE 50 MG/5ML IV SOSY
PREFILLED_SYRINGE | INTRAVENOUS | Status: DC | PRN
  Administered 2017-12-03: 30 mg via INTRAVENOUS

## 2017-12-03 MED ORDER — ACETAMINOPHEN 160 MG/5ML PO SOLN: 960.0000 mg | Freq: Once | ORAL | Status: DC

## 2017-12-03 MED ORDER — ACETAMINOPHEN 500 MG PO TABS
1000.0000 mg | ORAL_TABLET | Freq: Once | ORAL | Status: AC
  Administered 2017-12-03: 1000 mg via ORAL
  Filled 2017-12-03: qty 2

## 2017-12-03 MED ORDER — BUPIVACAINE HCL (PF) 0.5 % IJ SOLN
INTRAMUSCULAR | Status: AC
  Filled 2017-12-03: qty 30

## 2017-12-03 SURGICAL SUPPLY — 52 items
BAG ZIPLOCK 12X15 (MISCELLANEOUS) IMPLANT
BANDAGE ESMARK 6X9 LF (GAUZE/BANDAGES/DRESSINGS) ×1 IMPLANT
BENZOIN TINCTURE PRP APPL 2/3 (GAUZE/BANDAGES/DRESSINGS) IMPLANT
BIT DRILL 2.8X128 (BIT) IMPLANT
BIT DRILL 2.8X128MM (BIT)
BNDG CONFORM 4 STRL LF (GAUZE/BANDAGES/DRESSINGS) ×3 IMPLANT
BNDG ESMARK 6X9 LF (GAUZE/BANDAGES/DRESSINGS) ×3
CLOSURE WOUND 1/2 X4 (GAUZE/BANDAGES/DRESSINGS)
COVER SURGICAL LIGHT HANDLE (MISCELLANEOUS) ×3 IMPLANT
CUFF TOURN SGL QUICK 34 (TOURNIQUET CUFF) ×2
CUFF TRNQT CYL 34X4X40X1 (TOURNIQUET CUFF) ×1 IMPLANT
DRAPE POUCH INSTRU U-SHP 10X18 (DRAPES) ×3 IMPLANT
DRSG ADAPTIC 3X8 NADH LF (GAUZE/BANDAGES/DRESSINGS) ×3 IMPLANT
DRSG PAD ABDOMINAL 8X10 ST (GAUZE/BANDAGES/DRESSINGS) ×3 IMPLANT
DURAPREP 26ML APPLICATOR (WOUND CARE) ×3 IMPLANT
ELECT REM PT RETURN 15FT ADLT (MISCELLANEOUS) ×3 IMPLANT
GAUZE SPONGE 4X4 12PLY STRL (GAUZE/BANDAGES/DRESSINGS) ×3 IMPLANT
GLOVE BIOGEL PI IND STRL 6.5 (GLOVE) ×1 IMPLANT
GLOVE BIOGEL PI IND STRL 8.5 (GLOVE) ×1 IMPLANT
GLOVE BIOGEL PI INDICATOR 6.5 (GLOVE) ×2
GLOVE BIOGEL PI INDICATOR 8.5 (GLOVE) ×2
GLOVE ECLIPSE 8.0 STRL XLNG CF (GLOVE) ×3 IMPLANT
GLOVE SURG SS PI 6.5 STRL IVOR (GLOVE) ×3 IMPLANT
GOWN STRL REUS W/TWL LRG LVL3 (GOWN DISPOSABLE) ×3 IMPLANT
GOWN STRL REUS W/TWL XL LVL3 (GOWN DISPOSABLE) ×3 IMPLANT
MANIFOLD NEPTUNE II (INSTRUMENTS) ×3 IMPLANT
NEEDLE HYPO 22GX1.5 SAFETY (NEEDLE) ×3 IMPLANT
PACK ORTHO EXTREMITY (CUSTOM PROCEDURE TRAY) ×3 IMPLANT
PAD ABD 8X10 STRL (GAUZE/BANDAGES/DRESSINGS) ×3 IMPLANT
PAD CAST 4YDX4 CTTN HI CHSV (CAST SUPPLIES) ×1 IMPLANT
PADDING CAST COTTON 4X4 STRL (CAST SUPPLIES) ×2
PADDING CAST COTTON 6X4 STRL (CAST SUPPLIES) ×3 IMPLANT
PASSER SUT SWANSON 36MM LOOP (INSTRUMENTS) IMPLANT
POSITIONER SURGICAL ARM (MISCELLANEOUS) ×3 IMPLANT
SCOTCHCAST PLUS 4X4 WHITE (CAST SUPPLIES) IMPLANT
SPLINT FIBERGLASS 3X35 (CAST SUPPLIES) ×3 IMPLANT
SPLINT FIBERGLASS 4X15 (CAST SUPPLIES) ×3 IMPLANT
STAPLER VISISTAT (STAPLE) ×3 IMPLANT
STRIP CLOSURE SKIN 1/2X4 (GAUZE/BANDAGES/DRESSINGS) IMPLANT
SUCTION FRAZIER HANDLE 12FR (TUBING) ×2
SUCTION TUBE FRAZIER 12FR DISP (TUBING) ×1 IMPLANT
SUT ETHIBOND NAB CT1 #1 30IN (SUTURE) ×3 IMPLANT
SUT ETHILON 3 0 PS 1 (SUTURE) ×6 IMPLANT
SUT VIC AB 1 CT1 27 (SUTURE) ×2
SUT VIC AB 1 CT1 27XBRD ANTBC (SUTURE) ×1 IMPLANT
SUT VIC AB 2-0 CT1 27 (SUTURE) ×2
SUT VIC AB 2-0 CT1 TAPERPNT 27 (SUTURE) ×1 IMPLANT
SYR CONTROL 10ML LL (SYRINGE) ×3 IMPLANT
TISSUE DERMASPAND 5X5 STRL (Orthopedic Graft) ×3 IMPLANT
TOWEL OR 17X26 10 PK STRL BLUE (TOWEL DISPOSABLE) ×3 IMPLANT
TOWEL OR NON WOVEN STRL DISP B (DISPOSABLE) ×3 IMPLANT
YANKAUER SUCT BULB TIP 10FT TU (MISCELLANEOUS) ×3 IMPLANT

## 2017-12-03 NOTE — Progress Notes (Signed)
PT Cancellation Note  Patient Details Name: Eric Burch MRN: [id number] DOB: [birth year]   Cancelled Treatment:    Reason Eval/Treat Not Completed: Other (comment)(awaiting WB status, RN has paged MD. Will follow. )   Tamala SerUhlenberg, Klein Willcox Kistler 12/03/2017, 2:13 PM [phone]

## 2017-12-03 NOTE — Evaluation (Signed)
Physical Therapy Evaluation Patient Details Name: Eric Burch MRN: [id number] DOB: 1991-06-30 Today's Date: 12/03/2017   History of Present Illness  27 y.o. male admitted with Complete rupture of the left Achilles tendon. s/p repair 12/03/17.  Clinical Impression  Pt admitted with above diagnosis. Pt currently with functional limitations due to the deficits listed below (see PT Problem List). Supervision for stand pivot transfer to recliner using crutches. Instructed pt in L hip/knee exercises to minimize muscle atrophy.  Pt will benefit from skilled PT to increase their independence and safety with mobility to allow discharge to the venue listed below.       Follow Up Recommendations No PT follow up    Equipment Recommendations  None recommended by PT    Recommendations for Other Services       Precautions / Restrictions Precautions Precautions: Fall Precaution Comments: pt denies h/o falls Restrictions Weight Bearing Restrictions: Yes LLE Weight Bearing: Touchdown weight bearing      Mobility  Bed Mobility Overal bed mobility: Modified Independent             General bed mobility comments: with bedrail  Transfers Overall transfer level: Needs assistance Equipment used: Crutches Transfers: Sit to/from [employer] Sit to Stand: Supervision Stand pivot transfers: Supervision       General transfer comment: steady with crutches, pt maintained LLE in NWB position  Ambulation/Gait             General Gait Details: deferred, per RN Dr Darrelyn Hillock ordered transfers only  Stairs            Wheelchair Mobility    Modified Rankin (Stroke Patients Only)       Balance                                             Pertinent Vitals/Pain Pain Assessment: No/denies pain    Home Living Family/patient expects to be discharged to:: Private residence Living Arrangements: Spouse/significant other Available Help at  Discharge: Family Type of Home: House Home Access: Stairs to enter   Secretary/administrator of Steps: 3 Home Layout: Two level;Bed/bath upstairs Home Equipment: Crutches;Bedside commode      Prior Function Level of Independence: Independent         Comments: has been walking with crutches and boot since achilles rupture 11/28/17     Hand Dominance        Extremity/Trunk Assessment   Upper Extremity Assessment Upper Extremity Assessment: Overall WFL for tasks assessed    Lower Extremity Assessment Lower Extremity Assessment: LLE deficits/detail LLE Deficits / Details: L ankle in hard cast, can wiggle toes, sensation intact to light touch, SLR and knee ext at least 3/5, good quad set LLE: Unable to fully assess due to immobilization LLE Sensation: WNL    Cervical / Trunk Assessment Cervical / Trunk Assessment: Normal  Communication   Communication: No difficulties  Cognition Arousal/Alertness: Awake/alert Behavior During Therapy: WFL for tasks assessed/performed Overall Cognitive Status: Within Functional Limits for tasks assessed                                        General Comments      Exercises General Exercises - Lower Extremity Quad Sets: AROM;Left;5 reps;Supine Long Arc Quad: AROM;Left;10 reps;Seated Straight Leg  Raises: AROM;Left;5 reps;Supine   Assessment/Plan    PT Assessment Patient needs continued PT services  PT Problem List Decreased activity tolerance;Decreased mobility       PT Treatment Interventions DME instruction;Gait training;Stair training;Functional mobility training;Therapeutic exercise;Therapeutic activities    PT Goals (Current goals can be found in the Care Plan section)  Acute Rehab PT Goals Patient Stated Goal: to walk normally, return to work in [employer] de[employer] PT Goal Formulation: With patient/family Time For Goal Achievement: 12/10/17 Potential to Achieve Goals: Good    Frequency Min 6X/week    Barriers to discharge        Co-evaluation               AM-PAC PT "6 Clicks" Daily Activity  Outcome Measure Difficulty turning over in bed (including adjusting bedclothes, sheets and blankets)?: None Difficulty moving from lying on back to sitting on the side of the bed? : None Difficulty sitting down on and standing up from a chair with arms (e.g., wheelchair, bedside commode, etc,.)?: A Little Help needed moving to and from a bed to chair (including a wheelchair)?: A Little Help needed walking in hospital room?: A Little Help needed climbing 3-5 steps with a railing? : A Lot 6 Click Score: 19    End of Session Equipment Utilized During Treatment: Gait belt Activity Tolerance: Patient tolerated treatment well Patient left: in chair;with call bell/phone within reach;with family/visitor present Nurse Communication: Mobility status PT Visit Diagnosis: Difficulty in walking, not elsewhere classified (R26.2)    Time: [id number] PT Time Calculation (min) (ACUTE ONLY): 25 min   Charges:   PT Evaluation $PT Eval Low Complexity: 1 Low PT Treatments $Therapeutic Activity: 8-22 mins   PT G Codes:          Tamala SerUhlenberg, Najma Bozarth Kistler 12/03/2017, 3:27 PM [phone]

## 2017-12-03 NOTE — Op Note (Signed)
NAME:  [name], Eric                     ACCOUNT NO.:  MEDICAL RECORD NO.:  [id number]  LOCATION:                                 FACILITY:  PHYSICIAN:  Westlynn Fifer A. Adalaide Jaskolski, M.D.DATE OF BIRTH:  05-13-1991  DATE OF PROCEDURE:  12/03/2017 DATE OF DISCHARGE:                              OPERATIVE REPORT   SURGEON:  Georges Lynchonald A. Darrelyn HillockGioffre, M.D.  ASSISTANT:  Dimitri PedAmber Constable, GeorgiaPA.  PREOPERATIVE DIAGNOSIS:  Complete rupture of the left Achilles tendon.  POSTOPERATIVE DIAGNOSIS:  Complete rupture of the left Achilles tendon.  OPERATION: 1. Open repair of a complete disrupted left Achilles tendon. 2. Application of a DermaSpan graft. 3. Application of a short-leg cast with his foot in plantar flexion.  DESCRIPTION OF PROCEDURE:  Under general anesthesia, routine prep was carried out followed by a sterile prep of the left leg.  The appropriate time-out was carried out.  I also marked the appropriate left leg in the holding area.  At this time, a tourniquet was applied to the leg prior to the prep.  We then after the prep, exsanguinated the leg with an Esmarch.  The tourniquet was elevated to 325 mmHg.  The patient was in a prone position.  He had 2 g of IV Ancef.  At this time, a midline incision was made.  There were no bleeders cauterized.  We had good hemostasis.  The tourniquet was used as I described.  We avoided the sensory nerves in the area.  At this particular time, the incision was carried down to the disrupted tendon.  I was able to evacuate the hematoma, identified the tendon ends and a primary repair was carried out with #1 Ethibond suture followed by reinforcement with 0 Vicryl suture.  At this time, we then utilized a circumferential tubular graft of DermaSpan graft type around the tendon and sutured that to the tendon per se.  We thoroughly irrigated out the area.  We had a nice repair. We then closed the skin only with 3-0 nylon suture.  Sterile Neosporin bundle dressing was  applied.  The patient's foot was placed in plantar flexion and a short-leg cast was applied with adequate padding underneath.  The patient left the operating room in satisfactory condition.          ______________________________ Georges Lynchonald A. Darrelyn HillockGioffre, M.D.     RAG/MEDQ  D:  12/03/2017  T:  12/03/2017  Job:  [id number]

## 2017-12-03 NOTE — Anesthesia Postprocedure Evaluation (Signed)
Anesthesia Post Note  Patient: Eric Burch  Procedure(s) Performed: LEFT ACHILLES TENDON REPAIR with graft (Left )     Patient location during evaluation: PACU Anesthesia Type: General Level of consciousness: awake and alert Pain management: pain level controlled Vital Signs Assessment: post-procedure vital signs reviewed and stable Respiratory status: spontaneous breathing, nonlabored ventilation, respiratory function stable and patient connected to nasal cannula oxygen Cardiovascular status: blood pressure returned to baseline and stable Postop Assessment: no apparent nausea or vomiting Anesthetic complications: no    Last Vitals:  Vitals:   12/03/17 1300 12/03/17 1330  BP: (!) 152/91 (!) 164/89  Pulse: 76 60  Resp: 16 16  Temp: 37 C 37.2 C  SpO2: 100% 100%    Last Pain:  Vitals:   12/03/17 0836  TempSrc: Oral                 Whitnie Deleon EDWARD

## 2017-12-03 NOTE — Brief Op Note (Signed)
12/03/2017  12:05 PM  PATIENT:  Eric Burch  27 y.o. male  PRE-OPERATIVE DIAGNOSIS:  Left Achilles tendon complete rupture  POST-OPERATIVE DIAGNOSIS:  Left Achilles tendon complete rupture  PROCEDURE:  Procedure(s): LEFT ACHILLES TENDON REPAIR with graft (Left) and application of Short Leg Cast.  SURGEON:  Surgeon(s) and Role:    Ranee Gosselin* Rolinda Impson, MD - Primary  PHYSICIAN ASSISTANT: Dimitri PedAmber Constable PA  ASSISTANTS: Dimitri PedAmber Constable PA}   ANESTHESIA:   general  EBL:  None BLOOD ADMINISTERED:none  DRAINS: none   LOCAL MEDICATIONS USED:  MARCAINE  10cc of 0.50% plain.  SPECIMEN:  No Specimen  DISPOSITION OF SPECIMEN:  N/A  COUNTS:  YES  TOURNIQUET:   Total Tourniquet Time Documented: Thigh (Left) - 32 minutes Total: Thigh (Left) - 32 minutes   DICTATION: .Other Dictation: Dictation Number [id number]  PLAN OF CARE: Admit for overnight observation  PATIENT DISPOSITION:  PACU - hemodynamically stable.   Delay start of Pharmacological VTE agent (>24hrs) due to surgical blood loss or risk of bleeding: yes

## 2017-12-03 NOTE — Transfer of Care (Signed)
Immediate Anesthesia Transfer of Care Note  Patient: Eric Burch  Procedure(s) Performed: LEFT ACHILLES TENDON REPAIR with graft (Left )  Patient Location: PACU  Anesthesia Type:General  Level of Consciousness: awake, alert  and oriented  Airway & Oxygen Therapy: Patient Spontanous Breathing and Patient connected to face mask oxygen  Post-op Assessment: Report given to RN and Post -op Vital signs reviewed and stable  Post vital signs: Reviewed and stable  Last Vitals:  Vitals:   12/03/17 0836  BP: (!) 143/80  Pulse: 60  Resp: 18  Temp: 36.9 C  SpO2: 100%    Last Pain:  Vitals:   12/03/17 0836  TempSrc: Oral         Complications: No apparent anesthesia complications

## 2017-12-03 NOTE — Anesthesia Procedure Notes (Signed)
Procedure Name: Intubation Date/Time: 12/03/2017 11:08 AM Performed by: Maxwell Caul, CRNA Pre-anesthesia Checklist: Patient identified, Emergency Drugs available, Suction available and Patient being monitored Patient Re-evaluated:Patient Re-evaluated prior to induction Oxygen Delivery Method: Circle system utilized Preoxygenation: Pre-oxygenation with 100% oxygen Induction Type: IV induction Ventilation: Mask ventilation without difficulty Laryngoscope Size: Mac and 4 Grade View: Grade I Tube type: Oral Tube size: 7.5 mm Number of attempts: 1 Airway Equipment and Method: Stylet Placement Confirmation: ETT inserted through vocal cords under direct vision,  positive ETCO2 and breath sounds checked- equal and bilateral Secured at: 21 cm Tube secured with: Tape Dental Injury: Teeth and Oropharynx as per pre-operative assessment

## 2017-12-03 NOTE — Interval H&P Note (Signed)
History and Physical Interval Note:  12/03/2017 10:28 AM  Eric Burch  has presented today for surgery, with the diagnosis of Left Achilles tendon complete rupture  The various methods of treatment have been discussed with the patient and family. After consideration of risks, benefits and other options for treatment, the patient has consented to  Procedure(s): LEFT ACHILLES TENDON REPAIR with graft (Left) as a surgical intervention .  The patient's history has been reviewed, patient examined, no change in status, stable for surgery.  I have reviewed the patient's chart and labs.  Questions were answered to the patient's satisfaction.     Ranee Gosselinonald Keirah Konitzer

## 2017-12-03 NOTE — Anesthesia Preprocedure Evaluation (Signed)
Anesthesia Evaluation  Patient identified by MRN, date of birth, ID band Patient awake    Reviewed: Allergy & Precautions, H&P , Patient's Chart, lab work & pertinent test results, reviewed documented beta blocker date and time   Airway Mallampati: II  TM Distance: >3 FB Neck ROM: full    Dental no notable dental hx.    Pulmonary    Pulmonary exam normal breath sounds clear to auscultation       Cardiovascular  Rhythm:regular Rate:Normal     Neuro/Psych    GI/Hepatic   Endo/Other    Renal/GU      Musculoskeletal   Abdominal   Peds  Hematology   Anesthesia Other Findings   Reproductive/Obstetrics                             Anesthesia Physical Anesthesia Plan  ASA: I  Anesthesia Plan: General   Post-op Pain Management:    Induction: Intravenous  PONV Risk Score and Plan:   Airway Management Planned: LMA  Additional Equipment:   Intra-op Plan:   Post-operative Plan:   Informed Consent: I have reviewed the patients History and Physical, chart, labs and discussed the procedure including the risks, benefits and alternatives for the proposed anesthesia with the patient or authorized representative who has indicated his/her understanding and acceptance.   Dental Advisory Given  Plan Discussed with: CRNA and Surgeon  Anesthesia Plan Comments: ( )        Anesthesia Quick Evaluation

## 2017-12-04 DIAGNOSIS — Z79899 Other long term (current) drug therapy: Secondary | ICD-10-CM | POA: Diagnosis not present

## 2017-12-04 DIAGNOSIS — Z832 Family history of diseases of the blood and blood-forming organs and certain disorders involving the immune mechanism: Secondary | ICD-10-CM | POA: Diagnosis not present

## 2017-12-04 DIAGNOSIS — J309 Allergic rhinitis, unspecified: Secondary | ICD-10-CM | POA: Diagnosis not present

## 2017-12-04 DIAGNOSIS — S86012A Strain of left Achilles tendon, initial encounter: Secondary | ICD-10-CM | POA: Diagnosis not present

## 2017-12-04 DIAGNOSIS — Z7982 Long term (current) use of aspirin: Secondary | ICD-10-CM | POA: Diagnosis not present

## 2017-12-04 DIAGNOSIS — Z8249 Family history of ischemic heart disease and other diseases of the circulatory system: Secondary | ICD-10-CM | POA: Diagnosis not present

## 2017-12-04 LAB — BASIC METABOLIC PANEL
ANION GAP: 12 (ref 5–15)
BUN: 12 mg/dL (ref 6–20)
CHLORIDE: 102 mmol/L (ref 101–111)
CO2: 25 mmol/L (ref 22–32)
Calcium: 9.1 mg/dL (ref 8.9–10.3)
Creatinine, Ser: 1.21 mg/dL (ref 0.61–1.24)
GFR calc Af Amer: >60 mL/min (ref >60)
Glucose, Bld: 211 mg/dL — ABNORMAL HIGH (ref 65–99)
POTASSIUM: 4.1 mmol/L (ref 3.5–5.1)
SODIUM: 139 mmol/L (ref 135–145)

## 2017-12-04 MED ORDER — OXYCODONE HCL 5 MG PO TABS: 5.0000 mg | ORAL_TABLET | ORAL | 0 refills | Status: DC | PRN

## 2017-12-04 MED ORDER — METHOCARBAMOL 500 MG PO TABS: 500.0000 mg | ORAL_TABLET | Freq: Four times a day (QID) | ORAL | 0 refills | Status: DC | PRN

## 2017-12-04 NOTE — Discharge Instructions (Addendum)
Toe touch weightbearing on left leg for transfers only.  Use crutches when walking Wear post op shoe to protect cast Keep the cast dry Take aspirin 325mg  twice daily to avoid blood clots Call if any temperatures greater than 100 F, any wound or cast complications, severe pressure in the calf, numbness or tingling in the leg/toes: [phone] during the day and ask for Dr. Jeannetta EllisGioffre's nurse, Mackey Birchwoodammy Johnson. May return to work on 12/08/17 May return to upper body exercise 12/08/17

## 2017-12-04 NOTE — Progress Notes (Signed)
Physical Therapy Treatment Patient Details Name: Eric Burch MRN: [id number] DOB: Oct 25, 1990 Today's Date: 12/04/2017    History of Present Illness 27 y.o. male admitted with Complete rupture of the left Achilles tendon. s/p repair 12/03/17.    PT Comments    Pt sitting EOB dressed and eager to go home.  Present with L cast and L cast shoe.  Pt understands he is TTWB but prefers NWB.  Amb in hallway with B crutches and knee scooter both at Supervision level with good safety tech.  Practiced multiple stairs using B crutches with spouse present for "hands on" instruction safe handling/assist tech.  Pt aware to elevate L LE as much as possible and perform toe wiggles/scrunches.   Follow Up Recommendations  No PT follow up     Equipment Recommendations  None recommended by PT    Recommendations for Other Services       Precautions / Restrictions Restrictions Weight Bearing Restrictions: Yes LLE Weight Bearing: Touchdown weight bearing    Mobility  Bed Mobility               General bed mobility comments: OOB sitting on EOB  Transfers Overall transfer level: Needs assistance Equipment used: Crutches Transfers: Sit to/from [employer] Transfers Sit to Stand: Supervision Stand pivot transfers: Supervision       General transfer comment: steady with crutches, pt maintained LLE in NWB position  Ambulation/Gait Ambulation/Gait assistance: Supervision Ambulation Distance (Feet): 150 Feet Assistive device: Crutches(knee scooter) Gait Pattern/deviations: Step-to pattern     General Gait Details: crutches and knee scooter, pt demonstarted safe mobility   Stairs Stairs: Yes   Stair Management: No rails;Forwards;With crutches Number of Stairs: 6 General stair comments: with spouse present for "hands on" instruction on proper tech and safety.   Wheelchair Mobility    Modified Rankin (Stroke Patients Only)       Balance                                            Cognition Arousal/Alertness: Awake/alert Behavior During Therapy: WFL for tasks assessed/performed Overall Cognitive Status: Within Functional Limits for tasks assessed                                        Exercises      General Comments        Pertinent Vitals/Pain Pain Assessment: No/denies pain    Home Living                      Prior Function            PT Goals (current goals can now be found in the care plan section) Progress towards PT goals: Progressing toward goals    Frequency    Min 6X/week      PT Plan Current plan remains appropriate    Co-evaluation              AM-PAC PT "6 Clicks" Daily Activity  Outcome Measure  Difficulty turning over in bed (including adjusting bedclothes, sheets and blankets)?: None Difficulty moving from lying on back to sitting on the side of the bed? : None Difficulty sitting down on and standing up from a chair with arms (e.g., wheelchair, bedside commode, etc,.)?: None Help  needed moving to and from a bed to chair (including a wheelchair)?: None Help needed walking in hospital room?: None Help needed climbing 3-5 steps with a railing? : A Little 6 Click Score: 23    End of Session Equipment Utilized During Treatment: Gait belt Activity Tolerance: Patient tolerated treatment well Patient left: in chair;with call bell/phone within reach;with family/visitor present Nurse Communication: Mobility status       Time: [id number] PT Time Calculation (min) (ACUTE ONLY): 14 min  Charges:  $Gait Training: 8-22 mins                    G Codes:       Felecia ShellingLori Adrienne Delay  PTA [facility]  Acute  Rehab pager      [phone]

## 2017-12-04 NOTE — Progress Notes (Signed)
   Subjective: 1 Day Post-Op Procedure(s) (LRB): LEFT ACHILLES TENDON REPAIR with graft (Left) Patient reports pain as mild.   Patient seen in rounds for Dr. Darrelyn HillockGioffre. Patient is well, and has had no acute complaints or problems other than some discomfort in the left ankle. No issues overnight. No SOB or chest pain. Reports desire to return to work and working out.    Objective: Vital signs in last 24 hours: Temp:  [97.7 F (36.5 C)-99 F (37.2 C)] 97.9 F (36.6 C) (03/21 0500) Pulse Rate:  [60-94] 65 (03/21 0500) Resp:  [10-18] 16 (03/21 0500) BP: (138-166)/(74-119) 148/81 (03/21 0500) SpO2:  [97 %-100 %] 97 % (03/21 0500) Weight:  [81.6 kg (180 lb)] 81.6 kg (180 lb) (03/20 0836)  Intake/Output from previous day:  Intake/Output Summary (Last 24 hours) at 12/04/2017 0754 Last data filed at 12/04/2017 0600 Gross per 24 hour  Intake 3622.5 ml  Output 2370 ml  Net 1252.5 ml    Intake/Output this shift: No intake/output data recorded.  Labs: Recent Labs    12/03/17 1605  HGB 16.7   Recent Labs    12/03/17 1605  WBC 8.3  RBC 5.69  HCT 48.2  PLT 242   Recent Labs    12/03/17 1605 12/04/17 0547  NA  --  139  K  --  4.1  CL  --  102  CO2  --  25  BUN  --  12  CREATININE 1.16 1.21  GLUCOSE  --  211*  CALCIUM  --  9.1    EXAM General - Patient is Alert and Oriented Extremity - Neurologically intact Sensation intact distally Compartment soft  Cap refill 1 sec  Dressing/Incision - cast in good condition Motor Function - intact, moving toes well on exam.   Past Medical History:  Diagnosis Date  . Allergic conjunctivitis   . Allergic rhinitis   . Allergy     Assessment/Plan: 1 Day Post-Op Procedure(s) (LRB): LEFT ACHILLES TENDON REPAIR with graft (Left) Active Problems:   Achilles tendon rupture, left, initial encounter  Estimated body mass index is 23.11 kg/m as calculated from the following:   Height as of this encounter: 6\' 2"  (1.88 m).   Weight  as of this encounter: 81.6 kg (180 lb). Advance diet Up with therapy Discharge home   DVT Prophylaxis - Lovenox will transition to aspirin upon discharge Touchdown weightbearing on left LE  Eric Burch is doing well this morning. Discussed in details the discharge instructions. Plan to see in the office on Monday. Can return to work Monday if desired. Can return to upper body workouts Monday if desired. Will provide handicap parking paperwork at office.   Dimitri PedAmber Jalisia Puchalski PA-C Orthopaedic Surgery 12/04/2017, 7:54 AM

## 2017-12-08 NOTE — Discharge Summary (Signed)
Physician Discharge Summary   Patient ID: Eric Burch MRN: [id number] DOB/AGE: Sep 09, 1991 27 y.o.  Admit date: 12/03/2017 Discharge date: 12/04/2017  Primary Diagnosis: Left Achilles tendon tear   Admission Diagnoses:  Past Medical History:  Diagnosis Date  . Allergic conjunctivitis   . Allergic rhinitis   . Allergy    Discharge Diagnoses:   Active Problems:   Achilles tendon rupture, left, initial encounter  Estimated body mass index is 23.11 kg/m as calculated from the following:   Height as of this encounter: '6\' 2"'$  (1.88 m).   Weight as of this encounter: 81.6 kg (180 lb).  Procedure:  Procedure(s) (LRB): LEFT ACHILLES TENDON REPAIR with graft (Left)   Consults: None  HPI: The patient is a 27 year old male who presented to the ED with the chief complaint of left ankle and foot pain. He was playing basketball on 11/28/17 when he felt a painful pop around his Achilles tendon. He was evaluated at the ED and subsequently at the office where it was discovered that he had a tear of the Achilles tendon on the left. He was placed in a boot to stabilize the area until surgery.     Laboratory Data: Admission on 12/03/2017, Discharged on 12/04/2017  Component Date Value Ref Range Status  . WBC 12/03/2017 8.3  4.0 - 10.5 K/uL Final  . RBC 12/03/2017 5.69  4.22 - 5.81 MIL/uL Final  . Hemoglobin 12/03/2017 16.7  13.0 - 17.0 g/dL Final  . HCT 12/03/2017 48.2  39.0 - 52.0 % Final  . MCV 12/03/2017 84.7  78.0 - 100.0 fL Final  . MCH 12/03/2017 29.3  26.0 - 34.0 pg Final  . MCHC 12/03/2017 34.6  30.0 - 36.0 g/dL Final  . RDW 12/03/2017 12.8  11.5 - 15.5 % Final  . Platelets 12/03/2017 242  150 - 400 K/uL Final   Performed at [facility], [place] [address], [place], [place] [address]  . Creatinine, Ser 12/03/2017 1.16  0.61 - 1.24 mg/dL Final  . GFR calc non Af Amer 12/03/2017 >60  >60 mL/min Final  . GFR calc Af Amer 12/03/2017 >60  >60 mL/min Final   Comment:  (NOTE) The eGFR has been calculated using the CKD EPI equation. This calculation has not been validated in all clinical situations. eGFR's persistently <60 mL/min signify possible Chronic Kidney Disease. Performed at [facility], [place] [address], [place], [place] [address]   . Sodium 12/04/2017 139  135 - 145 mmol/L Final  . Potassium 12/04/2017 4.1  3.5 - 5.1 mmol/L Final  . Chloride 12/04/2017 102  101 - 111 mmol/L Final  . CO2 12/04/2017 25  22 - 32 mmol/L Final  . Glucose, Bld 12/04/2017 211* 65 - 99 mg/dL Final  . BUN 12/04/2017 12  6 - 20 mg/dL Final  . Creatinine, Ser 12/04/2017 1.21  0.61 - 1.24 mg/dL Final  . Calcium 12/04/2017 9.1  8.9 - 10.3 mg/dL Final  . GFR calc non Af Amer 12/04/2017 >60  >60 mL/min Final  . GFR calc Af Amer 12/04/2017 >60  >60 mL/min Final   Comment: (NOTE) The eGFR has been calculated using the CKD EPI equation. This calculation has not been validated in all clinical situations. eGFR's persistently <60 mL/min signify possible Chronic Kidney Disease.   Georgiann Hahn gap 12/04/2017 12  5 - 15 Final   Performed at [facility], [place] [address], [place], [place] [address]     X-Rays:Dg Ankle  Complete Left  Result Date: 11/28/2017 CLINICAL DATA:  Left ankle pain while playing basketball EXAM: LEFT ANKLE COMPLETE - 3+ VIEW COMPARISON:  None. FINDINGS: No acute fracture or subluxation. Well corticated osseous fragments at the tip of the medial malleolus. No suspicious focal osseous lesions. No radiopaque foreign body. IMPRESSION: No acute fracture or subluxation. Well corticated osseous fragments at the tip of the medial malleolus are compatible with remote injury. Electronically Signed   By: Ilona Sorrel M.D.   On: 11/28/2017 20:51    EKG:No orders found for this or any previous visit.   Hospital Course: DONTREZ PETTIS is a 27 y.o. who was admitted to [facility]. They were brought to the operating room on  12/03/2017 and underwent Procedure(s): [place] with graft.  Patient tolerated the procedure well and was later transferred to the recovery room and then to the orthopaedic floor for postoperative care.  They were given PO and IV analgesics for pain control following their surgery.  They were given 24 hours of postoperative antibiotics of  Anti-infectives (From admission, onward)   Start     Dose/Rate Route Frequency Ordered Stop   12/03/17 1700  ceFAZolin (ANCEF) IVPB 1 g/50 mL premix     1 g 100 mL/hr over 30 Minutes Intravenous Every 6 hours 12/03/17 1329 12/04/17 0530   12/03/17 1140  polymyxin B 500,000 Units, bacitracin 50,000 Units in sodium chloride 0.9 % 500 mL irrigation  Status:  Discontinued       As needed 12/03/17 1140 12/03/17 1221   12/03/17 0821  ceFAZolin (ANCEF) IVPB 2g/100 mL premix     2 g 200 mL/hr over 30 Minutes Intravenous On call to O.R. 12/03/17 8546 12/03/17 1124     and started on DVT prophylaxis in the form of Lovenox.   PT and OT were ordered. Discharge planning consulted to help with postop disposition and equipment needs.  Patient had a good night on the evening of surgery.  They started to get up OOB with therapy on day one. Cast was in good condition.  Patient was seen in rounds and was ready to go home.   Diet: Regular diet Activity:TDWB for transfers left LE Follow-up:in 3 days Disposition - Home Discharged Condition: stable   Discharge Instructions    Call MD / Call 911   Complete by:  As directed    If you experience chest pain or shortness of breath, CALL 911 and be transported to the hospital emergency room.  If you develope a fever above 101 F, pus (white drainage) or increased drainage or redness at the wound, or calf pain, call your surgeon's office.   Constipation Prevention   Complete by:  As directed    Drink plenty of fluids.  Prune juice may be helpful.  You may use a stool softener, such as Colace (over the counter) 100  mg twice a day.  Use MiraLax (over the counter) for constipation as needed.   Diet - low sodium heart healthy   Complete by:  As directed    Discharge instructions   Complete by:  As directed    Toe touch weightbearing on left leg for transfers only.  Use crutches when walking Wear post op shoe to protect cast Keep the cast dry Take aspirin '325mg'$  twice daily to avoid blood clots Call if any temperatures greater than 100 F, any wound or cast complications, severe pressure in the calf, numbness or tingling in the leg/toes: [phone] during  the day and ask for Dr. Charlestine Night nurse, Brunilda Payor.   Increase activity slowly as tolerated   Complete by:  As directed    Touch down weight bearing   Complete by:  As directed      Allergies as of 12/04/2017   No Known Allergies     Medication List    STOP taking these medications   diclofenac 50 MG EC tablet Commonly known as:  VOLTAREN   HYDROcodone-acetaminophen 5-325 MG tablet Commonly known as:  NORCO     TAKE these medications   aspirin 325 MG tablet Take 325 mg by mouth 2 (two) times daily.   ibuprofen 200 MG tablet Commonly known as:  ADVIL,MOTRIN Take 800 mg by mouth every 6 (six) hours as needed for headache or moderate pain.   methocarbamol 500 MG tablet Commonly known as:  ROBAXIN Take 1 tablet (500 mg total) by mouth every 6 (six) hours as needed for muscle spasms.   NON FORMULARY Take 1 Scoop by mouth daily. Gold Standard 100 % Whey Protein Powder   oxyCODONE 5 MG immediate release tablet Commonly known as:  Oxy IR/ROXICODONE Take 1-2 tablets (5-10 mg total) by mouth every 4 (four) hours as needed for severe pain (pain score 7-10).            Discharge Care Instructions  (From admission, onward)        Start     Ordered   12/04/17 0000  Touch down weight bearing     12/04/17 0654     Follow-up Information    Latanya Maudlin, MD. Schedule an appointment as soon as possible for a visit on 12/08/2017.     Specialty:  Orthopedic Surgery Contact information: [address] [place] [address] [phone]           Signed: Ardeen Jourdain, PA-C Orthopaedic Surgery 12/08/2017, 8:58 AM

## 2018-04-17 ENCOUNTER — Ambulatory Visit (INDEPENDENT_AMBULATORY_CARE_PROVIDER_SITE_OTHER): Payer: Self-pay | Admitting: Nurse Practitioner

## 2018-04-17 ENCOUNTER — Encounter: Payer: Self-pay | Admitting: Nurse Practitioner

## 2018-04-17 VITALS — BP 148/96 | HR 57 | Temp 97.9°F | Resp 18 | Wt 189.5 lb

## 2018-04-17 DIAGNOSIS — M436 Torticollis: Secondary | ICD-10-CM

## 2018-04-17 MED ORDER — PREDNISONE 10 MG (21) PO TBPK: ORAL_TABLET | ORAL | 0 refills | Status: AC

## 2018-04-17 MED ORDER — CYCLOBENZAPRINE HCL 10 MG PO TABS: 10.0000 mg | ORAL_TABLET | Freq: Three times a day (TID) | ORAL | 0 refills | Status: AC | PRN

## 2018-04-17 NOTE — Patient Instructions (Signed)
Acute Torticollis, Adult -Take medications as prescribed. -Take Tylenol ES- 1-2 tablets every 6 hours as needed for pain. -Apply ice to the right neck for 48 hours, 3-4 times/day, then switch to heat for 48 hours, 3-4 times/day. -Use neck exercises provided. -Use pillow for neck when sitting or sleeping until symptoms improve. -Follow up as needed.   Torticollis is a condition in which the muscles of the neck tighten (contract) abnormally, causing the neck to twist and the head to move into an unnatural position. Torticollis that develops suddenly is called acute torticollis. People with acute torticollis may have trouble turning their head. The condition can be painful and may range from mild to severe. What are the causes? This condition may be caused by:  Sleeping in an awkward position (common).  Extending or twisting the neck muscles beyond their normal position.  An injury to the neck muscles.  An infection.  A tumor.  Certain medicines.  Long-lasting spasms of the neck muscles.  In some cases, the cause may not be known. What increases the risk? You are more likely to develop this condition if:  You have a condition associated with loose ligaments, such as Down syndrome.  You have a brain condition that affects vision, such as strabismus.  What are the signs or symptoms? The main symptom of this condition is tilting of the head to one side. Other symptoms include:  Pain in the neck.  Trouble turning the head from side to side or up and down.  How is this diagnosed? This condition may be diagnosed based on:  A physical exam.  Your medical history.  Imaging tests, such as: ? An X-ray. ? An ultrasound. ? A CT scan. ? An MRI.  How is this treated? Treatment for this condition depends on what is causing the condition. Mild cases may go away without treatment. Treatment for more serious cases may include:  Medicines or shots to relax the muscles.  Other  medicines, such as antibiotics to treat the underlying cause.  Wearing a soft neck collar.  Physical therapy and stretching to improve neck strength and flexibility.  Neck massage.  In severe cases, surgery may be needed to repair dislocated or broken bones or to treat nerves in the neck. Follow these instructions at home:  Take over-the-counter and prescription medicines only as told by your health care provider.  Do stretching exercises and massage your neck as told by your health care provider.  If directed, apply heat to the affected area as often as told by your health care provider. Use the heat source that your health care provider recommends, such as a moist heat pack or a heating pad. ? Place a towel between your skin and the heat source. ? Leave the heat on for 20-30 minutes. ? Remove the heat if your skin turns bright red. This is especially important if you are unable to feel pain, heat, or cold. You may have a greater risk of getting burned.  If you wake up with torticollis after sleeping, check your bed or sleeping area. Look for lumpy pillows or unusual objects. Make sure your bed and sleeping area are comfortable.  Keep all follow-up visits as told by your health care provider. This is important. Contact a health care provider if:  You have a fever.  Your symptoms do not improve or they get worse. Get help right away if:  You have trouble breathing.  You develop noisy breathing (stridor).  You start to drool.  You have trouble swallowing or pain when swallowing.  You develop numbness or weakness in your hands or feet.  You have changes in your speech, understanding, or vision.  You are in severe pain.  You cannot move your head or neck. Summary  Torticollis is a condition in which the muscles of the neck tighten (contract) abnormally, causing the neck to twist and the head to move into an unnatural position. Torticollis that develops suddenly is called  acute torticollis.  Treatment for this condition depends on what is causing the condition. Mild cases may go away without treatment.  Do stretching exercises and massage your neck as told by your health care provider. You may also be instructed to apply heat to the area.  Contact your health care provider if your symptoms do not improve or they get worse. This information is not intended to replace advice given to you by your health care provider. Make sure you discuss any questions you have with your health care provider. Document Released: 08/30/2000 Document Revised: 10/31/2016 Document Reviewed: 10/31/2016 Elsevier Interactive Patient Education  2018 [employer]  Neck Exercises Neck exercises can be important for many reasons:  They can help you to improve and maintain flexibility in your neck. This can be especially important as you age.  They can help to make your neck stronger. This can make movement easier.  They can reduce or prevent neck pain.  They may help your upper back.  Ask your health care provider which neck exercises would be best for you. Exercises Neck Press Repeat this exercise 10 times. Do it first thing in the morning and right before bed or as told by your health care provider. 1. Lie on your back on a firm bed or on the floor with a pillow under your head. 2. Use your neck muscles to push your head down on the pillow and straighten your spine. 3. Hold the position as well as you can. Keep your head facing up and your chin tucked. 4. Slowly count to 5 while holding this position. 5. Relax for a few seconds. Then repeat.  Isometric Strengthening Do a full set of these exercises 2 times a day or as told by your health care provider. 1. Sit in a supportive chair and place your hand on your forehead. 2. Push forward with your head and neck while pushing back with your hand. Hold for 10 seconds. 3. Relax. Then repeat the exercise 3 times. 4. Next, do  thesequence again, this time putting your hand against the back of your head. Use your head and neck to push backward against the hand pressure. 5. Finally, do the same exercise on either side of your head, pushing sideways against the pressure of your hand.  Prone Head Lifts Repeat this exercise 5 times. Do this 2 times a day or as told by your health care provider. 1. Lie face-down, resting on your elbows so that your chest and upper back are raised. 2. Start with your head facing downward, near your chest. Position your chin either on or near your chest. 3. Slowly lift your head upward. Lift until you are looking straight ahead. Then continue lifting your head as far back as you can stretch. 4. Hold your head up for 5 seconds. Then slowly lower it to your starting position.  Supine Head Lifts Repeat this exercise 8-10 times. Do this 2 times a day or as told by your health care provider. 1. Lie on your back, bending  your knees to point to the ceiling and keeping your feet flat on the floor. 2. Lift your head slowly off the floor, raising your chin toward your chest. 3. Hold for 5 seconds. 4. Relax and repeat.  Scapular Retraction Repeat this exercise 5 times. Do this 2 times a day or as told by your health care provider. 1. Stand with your arms at your sides. Look straight ahead. 2. Slowly pull both shoulders backward and downward until you feel a stretch between your shoulder blades in your upper back. 3. Hold for 10-30 seconds. 4. Relax and repeat.  Contact a health care provider if:  Your neck pain or discomfort gets much worse when you do an exercise.  Your neck pain or discomfort does not improve within 2 hours after you exercise. If you have any of these problems, stop exercising right away. Do not do the exercises again unless your health care provider says that you can. Get help right away if:  You develop sudden, severe neck pain. If this happens, stop exercising right  away. Do not do the exercises again unless your health care provider says that you can. Exercises Neck Stretch  Repeat this exercise 3-5 times. 1. Do this exercise while standing or while sitting in a chair. 2. Place your feet flat on the floor, shoulder-width apart. 3. Slowly turn your head to the right. Turn it all the way to the right so you can look over your right shoulder. Do not tilt or tip your head. 4. Hold this position for 10-30 seconds. 5. Slowly turn your head to the left, to look over your left shoulder. 6. Hold this position for 10-30 seconds.  Neck Retraction Repeat this exercise 8-10 times. Do this 3-4 times a day or as told by your health care provider. 1. Do this exercise while standing or while sitting in a sturdy chair. 2. Look straight ahead. Do not bend your neck. 3. Use your fingers to push your chin backward. Do not bend your neck for this movement. Continue to face straight ahead. If you are doing the exercise properly, you will feel a slight sensation in your throat and a stretch at the back of your neck. 4. Hold the stretch for 1-2 seconds. Relax and repeat.  This information is not intended to replace advice given to you by your health care provider. Make sure you discuss any questions you have with your health care provider. Document Released: 08/14/2015 Document Revised: 02/08/2016 Document Reviewed: 03/13/2015 Elsevier Interactive Patient Education  [employer].

## 2018-04-17 NOTE — Progress Notes (Signed)
Subjective:    Patient ID: Eric Burch, male    DOB: February 14, 1991, 27 y.o.   MRN: [id number]  The patient is a 27 year old male who presents today for complaints of neck pain, and stiffness.  Patient states he initially noticed it about 3 weeks ago.  The patient states he woke up one morning and noticed there was increased pain when rotating his neck to the right side.  The patient states that he really did not think much about it and kept going.  The patient states today, although it has been intermittent since the onset, it was significantly worse.  The patient states he was sitting at work and noticed that shooting pain in his right neck.  The patient denies any fever, chills, "stiff" neck, weakness, malaise, numbness, tingling, or headache.  Patient denies any onset of injury such as a motor vehicle accident, injury, or sudden movement.  Patient goes on to state that he notices the pain in his right neck when he rotates his neck to the right side.  The patient denies any pain with rotation to the left, and with flexion and extension.  Neck Pain   The current episode started 1 to 4 weeks ago. The problem occurs intermittently. The pain is present in the left side. The pain is at a severity of 4/10. The pain is mild. The symptoms are aggravated by twisting and bending. The pain is same all the time. Stiffness is present all day. Pertinent negatives include no headaches, numbness, pain with swallowing, syncope, tingling, trouble swallowing or weakness. He has tried nothing for the symptoms.    Review of Systems  Constitutional: Negative.   HENT: Negative.  Negative for trouble swallowing.   Respiratory: Negative.   Cardiovascular: Negative.  Negative for syncope.  Musculoskeletal: Positive for neck pain and neck stiffness.  Neurological: Negative for tingling, weakness, numbness and headaches.      Objective:   Physical Exam  Constitutional: He is oriented to person, place, and time. He  appears well-developed and well-nourished. He appears distressed (due to neck pain).  HENT:  Head: Normocephalic and atraumatic.  Right Ear: External ear normal.  Left Ear: External ear normal.  Nose: Nose normal.  Mouth/Throat: Oropharynx is clear and moist. No oropharyngeal exudate.  Neck: Neck supple. No tracheal deviation present. No thyromegaly present.  Limited ROM.  Full rotation to the left, full flexion and extension.  Approximately 45 degree rotation to the right. Shooting pain in base of right neck with right rotation. + tenderness to base of right neck.  Cardiovascular: Normal rate, regular rhythm and normal heart sounds.  Pulmonary/Chest: Effort normal and breath sounds normal. No respiratory distress.  Musculoskeletal: He exhibits tenderness (base of right neck, no deformity or crepitus). He exhibits no edema.  Neurological: He is alert and oriented to person, place, and time.  Skin: Skin is warm and dry.  Psychiatric: He has a normal mood and affect. His behavior is normal.      Assessment & Plan:  Right torticollis  Exam findings, diagnosis etiology and medication use and indications reviewed with patient. Follow- Up and discharge instructions provided. No emergent/urgent issues found on exam.  Patient verbalized understanding of information provided and agrees with plan of care (POC), all questions answered.  1. Right torticollis  - predniSONE (STERAPRED UNI-PAK 21 TAB) 10 MG (21) TBPK tablet; Take as directed.  Dispense: 21 tablet; Refill: 0 - cyclobenzaprine (FLEXERIL) 10 MG tablet; Take 1 tablet (10 mg total) by  mouth 3 (three) times daily as needed for up to 10 days for muscle spasms.  Dispense: 30 tablet; Refill: 0 -Take medications as prescribed. -Take Tylenol ES- 1-2 tablets every 6 hours as needed for pain. Can start Ibuprofen 800mg  every 8 hours as needed for pain once prednisone has been completed. -Apply ice to the right neck for 48 hours, 3-4 times/day, then  switch to heat for 48 hours, 3-4 times/day. -Use neck exercises provided. -Use pillow for neck when sitting or sleeping until symptoms improve. -Follow up as needed.

## 2018-04-20 ENCOUNTER — Telehealth: Payer: Self-pay

## 2018-04-20 NOTE — Telephone Encounter (Signed)
I was no able to contact the patient in the number provided  

## 2018-11-22 ENCOUNTER — Emergency Department
Admission: EM | Admit: 2018-11-22 | Discharge: 2018-11-22 | Disposition: A | Discharge status: Home / Self Care | Payer: 59 | Source: Home / Self Care

## 2018-11-22 ENCOUNTER — Emergency Department (INDEPENDENT_AMBULATORY_CARE_PROVIDER_SITE_OTHER): Payer: 59

## 2018-11-22 ENCOUNTER — Encounter: Payer: Self-pay | Admitting: Emergency Medicine

## 2018-11-22 ENCOUNTER — Emergency Department: Payer: 59

## 2018-11-22 ENCOUNTER — Other Ambulatory Visit: Payer: Self-pay

## 2018-11-22 DIAGNOSIS — M7989 Other specified soft tissue disorders: Secondary | ICD-10-CM | POA: Diagnosis not present

## 2018-11-22 DIAGNOSIS — M79645 Pain in left finger(s): Secondary | ICD-10-CM

## 2018-11-22 DIAGNOSIS — S63611A Unspecified sprain of left index finger, initial encounter: Secondary | ICD-10-CM

## 2018-11-22 DIAGNOSIS — S6992XA Unspecified injury of left wrist, hand and finger(s), initial encounter: Secondary | ICD-10-CM | POA: Diagnosis not present

## 2018-11-22 NOTE — ED Triage Notes (Signed)
Here with left hand pointer finger swelling after basketball injury  3 weeks ago. Possible jam.

## 2018-11-22 NOTE — Discharge Instructions (Signed)
°  You may continue to splint your finger at home for another few weeks as noted in this packet, however, it is recommended you follow up with sports medicine in 1-2 weeks if not improving as they may do more extensive imaging or refer you to a hand surgeon.

## 2018-11-22 NOTE — ED Provider Notes (Signed)
[facility]    CSN: [id number] Arrival date & time: 11/22/18  1651     History   Chief Complaint Chief Complaint  Patient presents with  . Finger Injury    HPI JARVIN CARDONA is a 28 y.o. male.   HPI HARL VANDERKOOI is a 28 y.o. male presenting to UC with c/o Left index finger pain, swelling and slight decreased ROM after jamming it 3 weeks ago. He has been buddy taping his index finger to his middle finger and applying ice w/o relief. Pain is mild. He is Right hand dominant.    Past Medical History:  Diagnosis Date  . Allergic conjunctivitis   . Allergic rhinitis   . Allergy     Patient Active Problem List   Diagnosis Date Noted  . Achilles tendon rupture, left, initial encounter 12/03/2017  . ALLERGIC CONJUNCTIVITIS 01/05/2009  . ALLERGIC RHINITIS 01/05/2009    Past Surgical History:  Procedure Laterality Date  . ACHILLES TENDON SURGERY Left 12/03/2017   Procedure: LEFT ACHILLES TENDON REPAIR with graft;  Surgeon: Ranee Gosselin, MD;  Location: [facility];  Service: Orthopedics;  Laterality: Left;       Home Medications    Prior to Admission medications   Medication Sig Start Date End Date Taking? Authorizing Provider  aspirin 325 MG tablet Take 325 mg by mouth 2 (two) times daily.    [provider]  ibuprofen (ADVIL,MOTRIN) 200 MG tablet Take 800 mg by mouth every 6 (six) hours as needed for headache or moderate pain.     [provider]  methocarbamol (ROBAXIN) 500 MG tablet Take 1 tablet (500 mg total) by mouth every 6 (six) hours as needed for muscle spasms. Patient not taking: Reported on 04/17/2018 12/04/17   Dimitri Ped, PA-C  NON FORMULARY Take 1 Scoop by mouth daily. Gold Standard 100 % Whey Protein Powder    [provider]  oxyCODONE (OXY IR/ROXICODONE) 5 MG immediate release tablet Take 1-2 tablets (5-10 mg total) by mouth every 4 (four) hours as needed for severe pain (pain score 7-10). Patient not taking:  Reported on 04/17/2018 12/04/17   Dimitri Ped, PA-C    Family History Family History  Problem Relation Age of Onset  . Deep vein thrombosis Father   . Deep vein thrombosis Sister   . Sickle cell trait Sister     Social History Social History   Tobacco Use  . Smoking status: Never Smoker  . Smokeless tobacco: Never Used  Substance Use Topics  . Alcohol use: No    Alcohol/week: 0.0 - 1.0 standard drinks    Frequency: Never  . Drug use: No     Allergies   Patient has no known allergies.   Review of Systems Review of Systems  Musculoskeletal: Positive for arthralgias and joint swelling.  Skin: Negative for color change and wound.     Physical Exam Triage Vital Signs ED Triage Vitals  Enc Vitals Group     BP 11/22/18 1722 (!) 133/93     Pulse Rate 11/22/18 1722 79     Resp --      Temp 11/22/18 1722 98 F (36.7 C)     Temp Source 11/22/18 1722 Oral     SpO2 11/22/18 1722 99 %     Weight 11/22/18 1723 189 lb (85.7 kg)     Height 11/22/18 1723 6\' 2"  (1.88 m)     Head Circumference --      Peak Flow --  Pain Score 11/22/18 1723 2     Pain Loc --      Pain Edu? --      Excl. in GC? --    No data found.  Updated Vital Signs BP (!) 133/93 (BP Location: Left Arm)   Pulse 79   Temp 98 F (36.7 C) (Oral)   Ht  (1.88 m)   Wt 189 lb (85.7 kg)   SpO2 99%   BMI 24.27 kg/m   Visual Acuity Right Eye Distance:   Left Eye Distance:   Bilateral Distance:    Right Eye Near:   Left Eye Near:    Bilateral Near:     Physical Exam Vitals signs and nursing note reviewed.  Constitutional:      Appearance: He is well-developed.  HENT:     Head: Normocephalic and atraumatic.  Neck:     Musculoskeletal: Normal range of motion.  Cardiovascular:     Rate and Rhythm: Normal rate.  Pulmonary:     Effort: Pulmonary effort is normal.  Musculoskeletal: Normal range of motion.        General: Swelling and tenderness present.     Comments: Left index  finger: full ROM with decreased strength upon flexion at [facility] Dt and PIP. Mild tenderness with edema around PIP.  Skin:    General: Skin is warm and dry.     Capillary Refill: Capillary refill takes less than 2 seconds.     Findings: No bruising or erythema.  Neurological:     Mental Status: He is alert and oriented to person, place, and time.  Psychiatric:        Behavior: Behavior normal.      UC Treatments / Results  Labs (all labs ordered are listed, but only abnormal results are displayed) Labs Reviewed - No data to display  EKG None  Radiology Dg Finger Index Left  Result Date: 11/22/2018 CLINICAL DATA:  Injured index finger 3 weeks ago playing basketball with persistent pain and swelling. EXAM: LEFT INDEX FINGER 2+V COMPARISON:  None. FINDINGS: Suspected mild diffuse soft tissue swelling about the index finger. No associated fracture, dislocation or radiopaque foreign body. Joint spaces are preserved. No erosions. IMPRESSION: Mild diffuse soft tissue swelling the index finger without associated fracture. Electronically Signed   By: Simonne Come M.D.   On: 11/22/2018 18:02    Procedures Procedures (including critical care time)  Medications Ordered in UC Medications - No data to display  Initial Impression / Assessment and Plan / UC Course  I have reviewed the triage vital signs and the nursing notes.  Pertinent labs & imaging results that were available during my care of the patient were reviewed by me and considered in my medical decision making (see chart for details).     Reviewed imaging with pt Encouraged continued conservative tx and f/u with sports medicine in 1-2 weeks  AVS provided  Final Clinical Impressions(s) / UC Diagnoses   Final diagnoses:  Sprain of left index finger, initial encounter     Discharge Instructions      You may continue to splint your finger at home for another few weeks as noted in this packet, however, it is recommended you  follow up with sports medicine in 1-2 weeks if not improving as they may do more extensive imaging or refer you to a hand surgeon.     ED Prescriptions    None     Controlled Substance Prescriptions [place] Controlled Substance Registry consulted? Not  Applicable   Lurene Shadow, PA-C 11/22/18 1815

## 2019-09-01 DIAGNOSIS — Z20828 Contact with and (suspected) exposure to other viral communicable diseases: Secondary | ICD-10-CM | POA: Diagnosis not present

## 2019-10-05 ENCOUNTER — Other Ambulatory Visit: Payer: Self-pay

## 2019-10-06 ENCOUNTER — Ambulatory Visit: Payer: 59 | Admitting: Family Medicine

## 2019-10-06 ENCOUNTER — Other Ambulatory Visit (HOSPITAL_COMMUNITY)
Admission: RE | Admit: 2019-10-06 | Discharge: 2019-10-06 | Disposition: A | Discharge status: Home / Self Care | Payer: 59 | Source: Ambulatory Visit | Attending: Family Medicine | Admitting: Family Medicine

## 2019-10-06 ENCOUNTER — Encounter: Payer: Self-pay | Admitting: Family Medicine

## 2019-10-06 ENCOUNTER — Other Ambulatory Visit: Payer: Self-pay

## 2019-10-06 VITALS — BP 122/80 | HR 67 | Temp 97.7°F | Ht 74.0 in | Wt 205.0 lb

## 2019-10-06 DIAGNOSIS — Z113 Encounter for screening for infections with a predominantly sexual mode of transmission: Secondary | ICD-10-CM | POA: Insufficient documentation

## 2019-10-06 DIAGNOSIS — Z0000 Encounter for general adult medical examination without abnormal findings: Secondary | ICD-10-CM | POA: Diagnosis not present

## 2019-10-06 DIAGNOSIS — Z114 Encounter for screening for human immunodeficiency virus [HIV]: Secondary | ICD-10-CM

## 2019-10-06 DIAGNOSIS — Z23 Encounter for immunization: Secondary | ICD-10-CM

## 2019-10-06 LAB — COMPREHENSIVE METABOLIC PANEL
ALT: 30 U/L (ref 0–53)
AST: 20 U/L (ref 0–37)
Albumin: 4.4 g/dL (ref 3.5–5.2)
Alkaline Phosphatase: 62 U/L (ref 39–117)
BUN: 10 mg/dL (ref 6–23)
CO2: 30 mEq/L (ref 19–32)
Calcium: 9.8 mg/dL (ref 8.4–10.5)
Chloride: 105 mEq/L (ref 96–112)
Creatinine, Ser: 1.27 mg/dL (ref 0.40–1.50)
GFR: 80.07 mL/min (ref >60.00)
Glucose, Bld: 99 mg/dL (ref 70–99)
Potassium: 4.3 mEq/L (ref 3.5–5.1)
Sodium: 140 mEq/L (ref 135–145)
Total Bilirubin: 0.6 mg/dL (ref 0.2–1.2)
Total Protein: 6.9 g/dL (ref 6.0–8.3)

## 2019-10-06 LAB — CBC
HCT: 46.1 % (ref 39.0–52.0)
Hemoglobin: 15.5 g/dL (ref 13.0–17.0)
MCHC: 33.6 g/dL (ref 30.0–36.0)
MCV: 86.6 fl (ref 78.0–100.0)
Platelets: 270 10*3/uL (ref 150.0–400.0)
RBC: 5.32 Mil/uL (ref 4.22–5.81)
RDW: 13.5 % (ref 11.5–15.5)
WBC: 5 10*3/uL (ref 4.0–10.5)

## 2019-10-06 LAB — LIPID PANEL
Cholesterol: 166 mg/dL (ref 0–200)
HDL: 53.4 mg/dL (ref >39.00)
LDL Cholesterol: 99 mg/dL (ref 0–99)
NonHDL: 113.09
Total CHOL/HDL Ratio: 3
Triglycerides: 72 mg/dL (ref 0.0–149.0)
VLDL: 14.4 mg/dL (ref 0.0–40.0)

## 2019-10-06 NOTE — Progress Notes (Signed)
Chief Complaint  Patient presents with  . New Patient (Initial Visit)    needs a physical    Well Male Eric Burch is here for a complete physical.   His last physical was >1 year ago.  Current diet: in general, a "healthy" diet.   Current exercise: basketball, jogging, lifting weights Weight trend: stable Does pt snore? No. Daytime fatigue? No. Seat belt? Yes.    Health maintenance Tetanus- No  HIV- No  Past Medical History:  Diagnosis Date  . Allergic conjunctivitis   . Allergic rhinitis      Past Surgical History:  Procedure Laterality Date  . ACHILLES TENDON SURGERY Left 12/03/2017   Procedure: LEFT ACHILLES TENDON REPAIR with graft;  Surgeon: Ranee Gosselin, MD;  Location: [facility];  Service: Orthopedics;  Laterality: Left;    Medications  Takes no meds routinely.    Allergies No Known Allergies  Family History Family History  Problem Relation Age of Onset  . Deep vein thrombosis Father   . Deep vein thrombosis Sister   . Sickle cell trait Sister    Review of Systems: Constitutional: no fevers or chills Eye:  no recent significant change in vision Ear/Nose/Mouth/Throat:  Ears:  no hearing loss Nose/Mouth/Throat:  no complaints of nasal congestion, no sore throat Cardiovascular:  no chest pain Respiratory:  no shortness of breath Gastrointestinal:  no abdominal pain, no change in bowel habits GU:  Male: negative for dysuria, frequency, and incontinence Musculoskeletal/Extremities:  no pain of the joints Integumentary (Skin/Breast):  no abnormal skin lesions reported Neurologic:  no headaches Endocrine: No unexpected weight changes Hematologic/Lymphatic:  no night sweats  Exam BP 122/80 (BP Location: Left Arm, Patient Position: Sitting, Cuff Size: Normal)   Pulse 67   Temp 97.7 F (36.5 C) (Temporal)   Ht 6\' 2"  (1.88 m)   Wt 205 lb (93 kg)   SpO2 98%   BMI 26.32 kg/m  General:  well developed, well nourished, in no apparent distress Skin:  no  significant moles, warts, or growths Head:  no masses, lesions, or tenderness Eyes:  pupils equal and round, sclera anicteric without injection Ears:  canals without lesions, TMs shiny without retraction, no obvious effusion, no erythema Nose:  nares patent, septum midline, mucosa normal Throat/Pharynx:  lips and gingiva without lesion; tongue and uvula midline; non-inflamed pharynx; no exudates or postnasal drainage Neck: neck supple without adenopathy, thyromegaly, or masses Lungs:  clear to auscultation, breath sounds equal bilaterally, no respiratory distress Cardio:  regular rate and rhythm, no bruits, no LE edema Abdomen:  abdomen soft, nontender; bowel sounds normal; no masses or organomegaly Genital (male): circumcised penis, no lesions or discharge Rectal: Deferred Musculoskeletal:  symmetrical muscle groups noted without atrophy or deformity Extremities:  no clubbing, cyanosis, or edema, no deformities, no skin discoloration Neuro:  gait normal; deep tendon reflexes normal and symmetric Psych: well oriented with normal range of affect and appropriate judgment/insight  Assessment and Plan  Well adult exam - Plan: CBC, Comprehensive metabolic panel, Lipid panel  Screening for HIV (human immunodeficiency virus) - Plan: HIV Antibody (routine testing w rflx)  Routine screening for STI (sexually transmitted infection) - Plan: Urine cytology ancillary only([facility])   Well 29 y.o. male. Counseled on diet and exercise. Self testicular exams recommended at least monthly.  Other orders as above. Follow up in 1 year pending the above workup. The patient voiced understanding and agreement to the plan.  26 [place], DO 10/06/19 9:05 AM

## 2019-10-06 NOTE — Addendum Note (Signed)
Addended by: Scharlene Gloss B on: 10/06/2019 09:09 AM   Modules accepted: Orders

## 2019-10-06 NOTE — Patient Instructions (Signed)
Give us 2-3 business days to get the results of your labs back.   Keep the diet clean and stay active.  Do monthly self testicular checks in the shower. You are feeling for lumps/bumps that don't belong. If you feel anything like this, let me know!  Let us know if you need anything. 

## 2019-10-07 LAB — HIV ANTIBODY (ROUTINE TESTING W REFLEX): HIV 1&2 Ab, 4th Generation: NONREACTIVE

## 2019-10-08 LAB — URINE CYTOLOGY ANCILLARY ONLY
Chlamydia: NEGATIVE
Comment: NEGATIVE
Comment: NEGATIVE
Comment: NORMAL
Neisseria Gonorrhea: NEGATIVE
Trichomonas: NEGATIVE

## 2019-11-09 ENCOUNTER — Encounter: Payer: Self-pay | Admitting: Family Medicine

## 2020-03-16 IMAGING — DX DG FINGER INDEX 2+V*L*
3 series · 3 of 3 positions shown · non-contrast
Comparison: None.

CLINICAL DATA: Injured index finger 3 weeks ago playing basketball
with persistent pain and swelling.

EXAM:
LEFT INDEX FINGER 2+V

[finger ap]
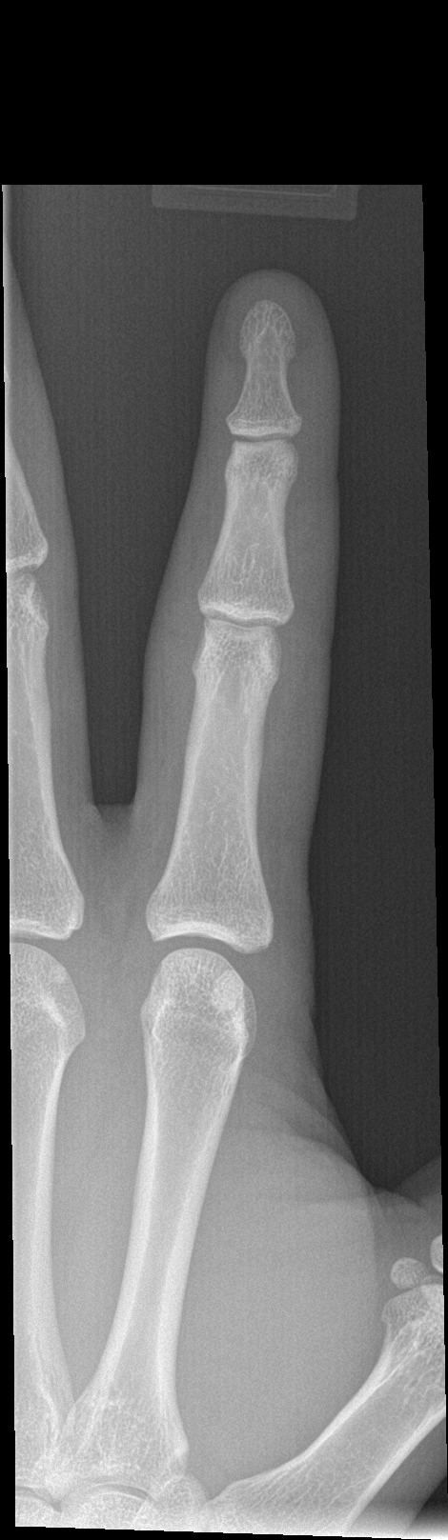

[finger obl]
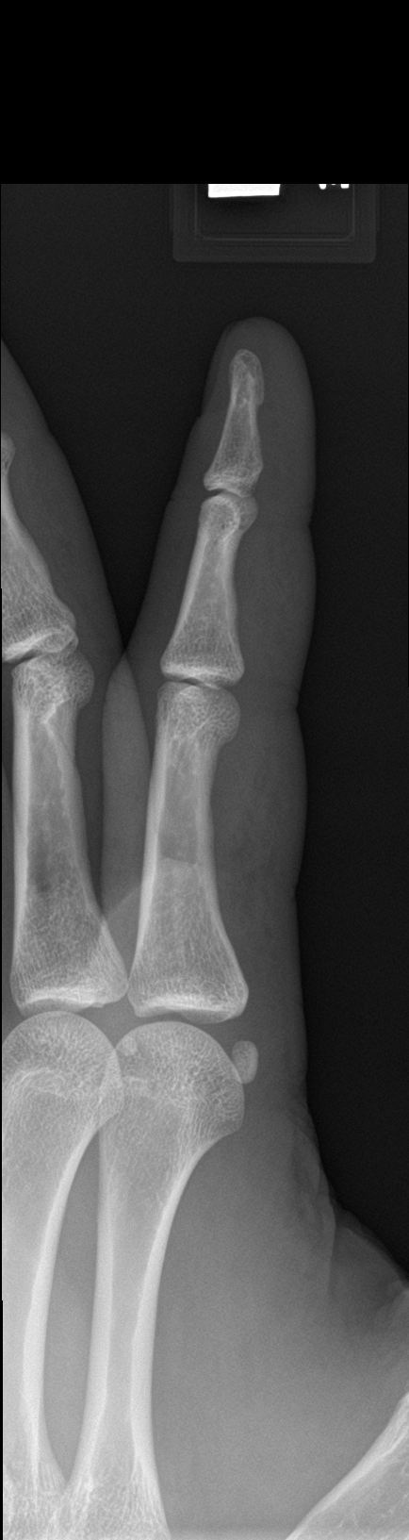

[finger lat]
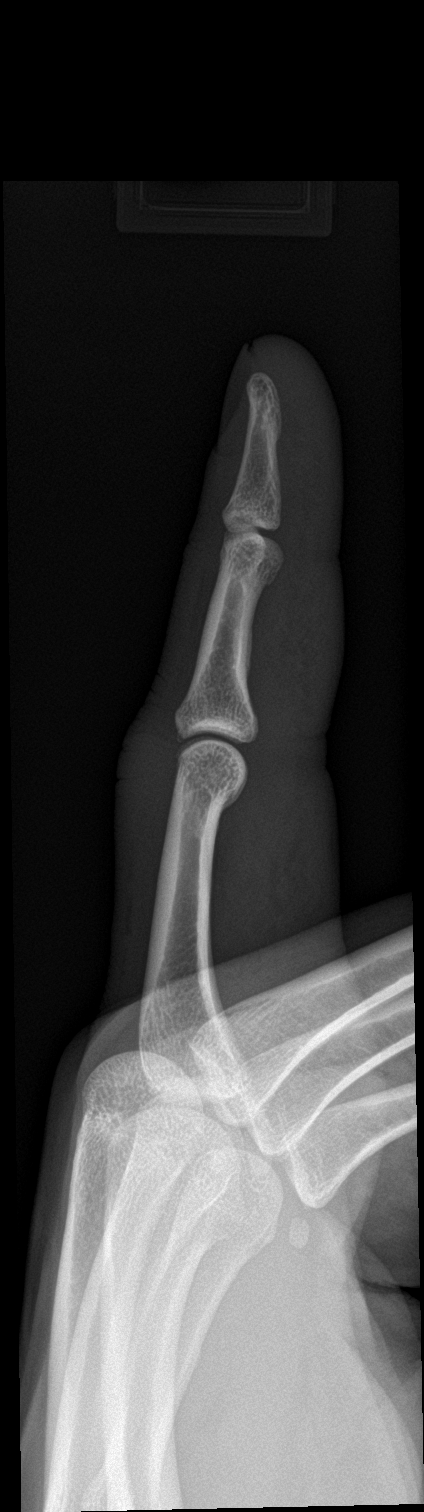

[3 of 3 positions shown; findings below may reference images not displayed]

FINDINGS: Suspected mild diffuse soft tissue swelling about the index finger.
No associated fracture, dislocation or radiopaque foreign body.
Joint spaces are preserved. No erosions.
IMPRESSION: Mild diffuse soft tissue swelling the index finger without
associated fracture.

## 2020-05-15 ENCOUNTER — Ambulatory Visit: Payer: 59 | Admitting: Family Medicine

## 2020-05-19 ENCOUNTER — Encounter: Payer: Self-pay | Admitting: Family Medicine

## 2020-05-19 ENCOUNTER — Ambulatory Visit: Payer: 59 | Admitting: Family Medicine

## 2020-05-19 ENCOUNTER — Other Ambulatory Visit: Payer: Self-pay

## 2020-05-19 VITALS — BP 118/78 | HR 62 | Temp 98.2°F | Ht 73.0 in | Wt 208.0 lb

## 2020-05-19 DIAGNOSIS — K594 Anal spasm: Secondary | ICD-10-CM

## 2020-05-19 MED ORDER — DILTIAZEM GEL 2 %: 1.0000 "application " | Freq: Two times a day (BID) | CUTANEOUS | 0 refills | Status: AC

## 2020-05-19 MED ORDER — DILTIAZEM GEL 2 %: 1.0000 "application " | Freq: Two times a day (BID) | CUTANEOUS | 0 refills | Status: DC

## 2020-05-19 NOTE — Progress Notes (Signed)
Chief Complaint  Patient presents with  . Rectal Pain    Subjective: Patient is a 29 y.o. male here for rectal pain.  For the past 10 years, the patient has had very intermittent burst of sharp rectal pain lasting several seconds.  Frequency is approximately once every 6 months.  It does not radiate.  Over the past week, it became more frequent.  No triggering events.  There is no pain exacerbated by using the restroom, either urination or defecation.  He has no blood in his stool or on toilet tissue.  No abdominal pain, nausea, vomiting, reports of injury, or change in stool consistency/caliber.  He has not tried anything.  He does not currently have pain.  Past Medical History:  Diagnosis Date  . Allergic conjunctivitis   . Allergic rhinitis     Objective: BP 118/78 (BP Location: Left Arm, Patient Position: Sitting, Cuff Size: Normal)   Pulse 62   Temp 98.2 F (36.8 C) (Oral)   Ht 6\' 1"  (1.854 m)   Wt 208 lb (94.3 kg)   SpO2 97%   BMI 27.44 kg/m  General: Awake, appears stated age Rectal: No external lesions, sphincter of good tone, there is no tenderness to palpation over the external sphincter though he notes that is where the pain is located; no external hemorrhoids Abdomen: Bowel sounds present, soft, nontender, nondistended, no masses or organomegaly Lungs: No accessory muscle use Psych: Age appropriate judgment and insight, normal affect and mood  Assessment and Plan: Proctalgia fugax - Plan: diltiazem 2 % GEL  Topical gel twice daily.  Explained this is self-limited.  If there is no improvement or worsening, he will let me know and we will refer to the gastroenterology team for further evaluation. Follow-up as originally scheduled. The patient voiced understanding and agreement to the plan.  [place], DO 05/19/20  7:21 AM

## 2020-05-19 NOTE — Patient Instructions (Signed)
Send me a message if we aren't turning the corner. This should get better on its own though.  Let us know if you need anything.

## 2020-10-09 ENCOUNTER — Encounter: Payer: 59 | Admitting: Family Medicine

## 2022-04-22 DIAGNOSIS — Z111 Encounter for screening for respiratory tuberculosis: Secondary | ICD-10-CM | POA: Diagnosis not present

## 2022-08-23 ENCOUNTER — Telehealth: Payer: Self-pay

## 2022-08-23 ENCOUNTER — Ambulatory Visit
Admission: EM | Admit: 2022-08-23 | Discharge: 2022-08-23 | Disposition: A | Discharge status: Home / Self Care | Payer: Commercial Managed Care - PPO | Attending: Emergency Medicine | Admitting: Emergency Medicine

## 2022-08-23 DIAGNOSIS — R6889 Other general symptoms and signs: Secondary | ICD-10-CM

## 2022-08-23 DIAGNOSIS — J069 Acute upper respiratory infection, unspecified: Secondary | ICD-10-CM | POA: Diagnosis present

## 2022-08-23 DIAGNOSIS — Z1152 Encounter for screening for COVID-19: Secondary | ICD-10-CM | POA: Diagnosis not present

## 2022-08-23 DIAGNOSIS — Z20828 Contact with and (suspected) exposure to other viral communicable diseases: Secondary | ICD-10-CM | POA: Insufficient documentation

## 2022-08-23 DIAGNOSIS — J101 Influenza due to other identified influenza virus with other respiratory manifestations: Secondary | ICD-10-CM | POA: Insufficient documentation

## 2022-08-23 DIAGNOSIS — Z79899 Other long term (current) drug therapy: Secondary | ICD-10-CM | POA: Insufficient documentation

## 2022-08-23 LAB — RESP PANEL BY RT-PCR (FLU A&B, COVID) ARPGX2
Influenza A by PCR: NEGATIVE
Influenza B by PCR: POSITIVE — AB
SARS Coronavirus 2 by RT PCR: NEGATIVE

## 2022-08-23 MED ORDER — PROMETHAZINE-DM 6.25-15 MG/5ML PO SYRP: 5.0000 mL | ORAL_SOLUTION | Freq: Four times a day (QID) | ORAL | 0 refills | Status: AC | PRN

## 2022-08-23 MED ORDER — IPRATROPIUM BROMIDE 0.06 % NA SOLN: 2.0000 | Freq: Four times a day (QID) | NASAL | 0 refills | Status: AC

## 2022-08-23 MED ORDER — OSELTAMIVIR PHOSPHATE 75 MG PO CAPS: 75.0000 mg | ORAL_CAPSULE | Freq: Two times a day (BID) | ORAL | 0 refills | Status: AC

## 2022-08-23 NOTE — Discharge Instructions (Addendum)
COVID, flu will be back in 24 hours.  Wait-and-see prescription of Tamiflu.  Atrovent nasal spray, saline nasal irrigation with a NeilMed sinus rinse and distilled water as often as you want to prevent a bacterial sinus infection, Promethazine DM, discontinue DayQuil, NyQuil, start Mucinex D. Below is a list of primary care practices who are taking new patients for you to follow-up with.  [facility] -multiple locations.  See website at [email or web address].[email or web address]  [facility] internal medicine clinic Ground Floor - [facility], [address] [place], [place], [place] [address] [phone]  [facility] Care at [facility] [address] [place], [place] [address] [phone]  [facility]- will see patients with no insurance [address] [place], [place] [address] [place], [place] [address] [phone]  [facility] [phone] [address] [place] [address]  [facility]: [address] [place] [address]  [phone]  [facility]: [address] [place] [address]  [phone]  [facility] : [address] [place] [address] [phone]  [facility] Primary Care: [address] [place] [address] [phone]  [facility]: [address] [place] [address] [phone]  Dr. Oneal Grout [address] [employer] [facility]  [facility]- will see patients with no insurance. [address] [place], [place] [address] [phone]  Go to www.goodrx.com  or www.costplusdrugs.com to look up your medications. This will give you a list of where you can find your prescriptions at the most affordable prices. Or ask the pharmacist what the cash price is, or if they have any other discount programs  available to help make your medication more affordable. This can be less expensive than what you would pay with insurance.

## 2022-08-23 NOTE — Telephone Encounter (Signed)
Spoke with pt and advised that he is flu B positive. Pt verbalized understanding and had no additional questions.

## 2022-08-23 NOTE — ED Triage Notes (Signed)
C/O fever, chills, night sweats, body aches and congestion. Pt states his daughter has the flu and 2 of his children had RSV.Marland Kitchen

## 2022-08-23 NOTE — ED Provider Notes (Addendum)
HPI  SUBJECTIVE:  Eric Burch is a 31 y.o. male who presents with flulike symptoms starting yesterday.  He reports feeling feverish with chills, body aches, headaches, nasal congestion, rhinorrhea, cough.  He is unable to sleep at night secondary to the cough.  His daughter was diagnosed with flu 6 days ago.  No sore throat, postnasal drip, wheezing, shortness of breath, vomiting, diarrhea, abdominal pain.  No known COVID exposure.  He got 2 doses of the COVID-vaccine and this years flu vaccine.  No antipyretic in the past 6 hours.  He has been taking ibuprofen, DayQuil and NyQuil with improvement in his symptoms.  No aggravating factors.  He has no past medical history.  PCP: None.   Past Medical History:  Diagnosis Date   Allergic conjunctivitis    Allergic rhinitis     Past Surgical History:  Procedure Laterality Date   ACHILLES TENDON SURGERY Left 12/03/2017   Procedure: LEFT ACHILLES TENDON REPAIR with graft;  Surgeon: Ranee Gosselin, MD;  Location: [facility];  Service: Orthopedics;  Laterality: Left;    Family History  Problem Relation Age of Onset   Deep vein thrombosis Father    Deep vein thrombosis Sister    Sickle cell trait Sister     Social History   Tobacco Use   Smoking status: Never   Smokeless tobacco: Never  Vaping Use   Vaping Use: Never used  Substance Use Topics   Alcohol use: No    Alcohol/week: 0.0 - 1.0 standard drinks of alcohol   Drug use: No    No current facility-administered medications for this encounter.  Current Outpatient Medications:    ipratropium (ATROVENT) 0.06 % nasal spray, Place 2 sprays into both nostrils 4 (four) times daily., Disp: 15 mL, Rfl: 0   oseltamivir (TAMIFLU) 75 MG capsule, Take 1 capsule (75 mg total) by mouth 2 (two) times daily. X 5 days, Disp: 10 capsule, Rfl: 0   promethazine-dextromethorphan (PROMETHAZINE-DM) 6.25-15 MG/5ML syrup, Take 5 mLs by mouth 4 (four) times daily as needed for cough., Disp: 118 mL, Rfl: 0    diltiazem 2 % GEL, Apply 1 application topically 2 (two) times daily., Disp: 30 g, Rfl: 0  No Known Allergies   ROS  As noted in HPI.   Physical Exam  BP 124/75 (BP Location: Left Arm)   Pulse 73   Temp 98.3 F (36.8 C) (Oral)   Resp 16   SpO2 97%   Constitutional: Well developed, well nourished, no acute distress Eyes:  EOMI, conjunctiva normal bilaterally HENT: Normocephalic, atraumatic,mucus membranes moist.  Positive nasal congestion.  No maxillary, frontal sinus tenderness. Neck: No cervical lymphadenopathy Respiratory: Normal inspiratory effort, lungs clear bilaterally.  No anterior, lateral chest wall tenderness Cardiovascular: Normal rate, regular rhythm, no murmurs rubs or gallops. GI: nondistended skin: No rash, skin intact Musculoskeletal: no deformities Neurologic: Alert & oriented x 3, no focal neuro deficits Psychiatric: Speech and behavior appropriate   ED Course   Medications - No data to display  Orders Placed This Encounter  Procedures   Resp Panel by RT-PCR (Flu A&B, Covid) Anterior Nasal Swab    Standing Status:   Standing    Number of Occurrences:   1    Results for orders placed or performed during the hospital encounter of 08/23/22 (from the past 24 hour(s))  Resp Panel by RT-PCR (Flu A&B, Covid) Anterior Nasal Swab     Status: Abnormal   Collection Time: 08/23/22  9:07 AM   Specimen:  Anterior Nasal Swab  Result Value Ref Range   SARS Coronavirus 2 by RT PCR NEGATIVE NEGATIVE   Influenza A by PCR NEGATIVE NEGATIVE   Influenza B by PCR POSITIVE (A) NEGATIVE   No results found.  ED Clinical Impression  1. Influenza B   2. Exposure to influenza   3. Viral upper respiratory tract infection with cough      ED Assessment/Plan   Influenza B positive.  Sent home with a wait-and-see prescription of Tamiflu.  Patient to start this.  Sent MyChart message.  Staff will also notify patient.  Patient with close exposure to influenza presenting  with influenza-like symptoms.  Checking COVID, flu.  He has MyChart.  Will send home with a wait-and-see prescription of Tamiflu.  Atrovent nasal spray, saline nasal irrigation, Promethazine DM, discontinue DayQuil, NyQuil, start Mucinex D.  Will provide primary care list for ongoing care.  Discussed labs, MDM, treatment plan, and plan for follow-up with patient. patient agrees with plan.   Meds ordered this encounter  Medications   oseltamivir (TAMIFLU) 75 MG capsule    Sig: Take 1 capsule (75 mg total) by mouth 2 (two) times daily. X 5 days    Dispense:  10 capsule    Refill:  0   promethazine-dextromethorphan (PROMETHAZINE-DM) 6.25-15 MG/5ML syrup    Sig: Take 5 mLs by mouth 4 (four) times daily as needed for cough.    Dispense:  118 mL    Refill:  0   ipratropium (ATROVENT) 0.06 % nasal spray    Sig: Place 2 sprays into both nostrils 4 (four) times daily.    Dispense:  15 mL    Refill:  0      *This clinic note was created using Scientist, clinical (histocompatibility and immunogenetics). Therefore, there may be occasional mistakes despite careful proofreading.  ?    Domenick Gong, MD 08/23/22 0981    Domenick Gong, MD 08/23/22 2031
